# Patient Record
Sex: Male | Born: 1955 | Race: Black or African American | Hispanic: No | Marital: Married | Smoking: Never smoker
Health system: Southern US, Community
[De-identification: ages and names within clinical notes are randomized; demographics above are authoritative.]

## PROBLEM LIST (undated history)

## (undated) ENCOUNTER — Emergency Department (HOSPITAL_COMMUNITY): Payer: Managed Care, Other (non HMO) | Source: Home / Self Care

## (undated) DIAGNOSIS — Z8673 Personal history of transient ischemic attack (TIA), and cerebral infarction without residual deficits: Secondary | ICD-10-CM

## (undated) DIAGNOSIS — I639 Cerebral infarction, unspecified: Secondary | ICD-10-CM

## (undated) DIAGNOSIS — I10 Essential (primary) hypertension: Secondary | ICD-10-CM

## (undated) DIAGNOSIS — R55 Syncope and collapse: Secondary | ICD-10-CM

## (undated) HISTORY — DX: Essential (primary) hypertension: I10

## (undated) HISTORY — DX: Personal history of transient ischemic attack (TIA), and cerebral infarction without residual deficits: Z86.73

---

## 2012-10-29 ENCOUNTER — Encounter: Payer: Self-pay | Admitting: *Deleted

## 2012-11-08 ENCOUNTER — Ambulatory Visit: Payer: Self-pay | Admitting: Cardiovascular Disease

## 2013-11-30 ENCOUNTER — Emergency Department: Payer: Self-pay | Admitting: Internal Medicine

## 2013-11-30 LAB — URINALYSIS, COMPLETE
BACTERIA: NONE SEEN
BILIRUBIN, UR: NEGATIVE
Blood: NEGATIVE
Glucose,UR: NEGATIVE mg/dL (ref 0–75)
Ketone: NEGATIVE
Leukocyte Esterase: NEGATIVE
Nitrite: NEGATIVE
Ph: 6 (ref 4.5–8.0)
Protein: 30
RBC,UR: 1 /HPF (ref 0–5)
SPECIFIC GRAVITY: 1.013 (ref 1.003–1.030)
Squamous Epithelial: <1

## 2013-11-30 LAB — COMPREHENSIVE METABOLIC PANEL
ALBUMIN: 4.1 g/dL (ref 3.4–5.0)
ALK PHOS: 52 U/L
AST: 46 U/L — AB (ref 15–37)
Anion Gap: 12 (ref 7–16)
BILIRUBIN TOTAL: 0.5 mg/dL (ref 0.2–1.0)
BUN: 16 mg/dL (ref 7–18)
CALCIUM: 8.7 mg/dL (ref 8.5–10.1)
CO2: 23 mmol/L (ref 21–32)
Chloride: 98 mmol/L (ref 98–107)
Creatinine: 2.02 mg/dL — ABNORMAL HIGH (ref 0.60–1.30)
EGFR (Non-African Amer.): 35 — ABNORMAL LOW
GFR CALC AF AMER: 41 — AB
GLUCOSE: 109 mg/dL — AB (ref 65–99)
Osmolality: 268 (ref 275–301)
Potassium: 2.9 mmol/L — ABNORMAL LOW (ref 3.5–5.1)
SGPT (ALT): 56 U/L (ref 12–78)
Sodium: 133 mmol/L — ABNORMAL LOW (ref 136–145)
Total Protein: 8.7 g/dL — ABNORMAL HIGH (ref 6.4–8.2)

## 2013-11-30 LAB — CBC
HCT: 42.9 % (ref 40.0–52.0)
HGB: 14.1 g/dL (ref 13.0–18.0)
MCH: 27.3 pg (ref 26.0–34.0)
MCHC: 32.8 g/dL (ref 32.0–36.0)
MCV: 83 fL (ref 80–100)
PLATELETS: 216 10*3/uL (ref 150–440)
RBC: 5.15 10*6/uL (ref 4.40–5.90)
RDW: 14 % (ref 11.5–14.5)
WBC: 10 10*3/uL (ref 3.8–10.6)

## 2013-11-30 LAB — TROPONIN I: Troponin-I: <0.02 ng/mL

## 2015-03-22 ENCOUNTER — Encounter: Payer: Self-pay | Admitting: Family Medicine

## 2015-03-22 ENCOUNTER — Ambulatory Visit (INDEPENDENT_AMBULATORY_CARE_PROVIDER_SITE_OTHER): Payer: Managed Care, Other (non HMO) | Admitting: Family Medicine

## 2015-03-22 VITALS — BP 122/70 | HR 83 | Temp 98.9°F | Ht 70.0 in | Wt 229.8 lb

## 2015-03-22 DIAGNOSIS — E876 Hypokalemia: Secondary | ICD-10-CM | POA: Diagnosis not present

## 2015-03-22 DIAGNOSIS — I10 Essential (primary) hypertension: Secondary | ICD-10-CM

## 2015-03-22 DIAGNOSIS — T502X5A Adverse effect of carbonic-anhydrase inhibitors, benzothiadiazides and other diuretics, initial encounter: Secondary | ICD-10-CM | POA: Diagnosis not present

## 2015-03-22 DIAGNOSIS — Z0000 Encounter for general adult medical examination without abnormal findings: Secondary | ICD-10-CM | POA: Insufficient documentation

## 2015-03-22 DIAGNOSIS — R011 Cardiac murmur, unspecified: Secondary | ICD-10-CM | POA: Insufficient documentation

## 2015-03-22 DIAGNOSIS — E785 Hyperlipidemia, unspecified: Secondary | ICD-10-CM | POA: Diagnosis not present

## 2015-03-22 DIAGNOSIS — E669 Obesity, unspecified: Secondary | ICD-10-CM | POA: Insufficient documentation

## 2015-03-22 MED ORDER — POTASSIUM CHLORIDE 20 MEQ PO PACK: 20.0000 meq | PACK | Freq: Once | ORAL | Status: DC

## 2015-03-22 MED ORDER — BENICAR HCT 40-25 MG PO TABS: 1.0000 | ORAL_TABLET | Freq: Every day | ORAL | Status: DC

## 2015-03-22 NOTE — Progress Notes (Signed)
Name: Tyler Gomez   MRN: [id number]    DOB: 02/03/1956   Date:03/22/2015       Progress Note  Subjective  Chief Complaint  Chief Complaint  Patient presents with  . Follow-up    chloresterol check, Discuss ED med    Hyperlipidemia This is a chronic problem. Recent lipid tests were reviewed and are normal (HDL 35, LDL 108, TC 157 TG 70). He has no history of diabetes or hypothyroidism. Pertinent negatives include no chest pain, leg pain, myalgias or shortness of breath. He is currently on no antihyperlipidemic treatment.  Hypertension This is a chronic problem. The problem is controlled. Pertinent negatives include no chest pain, headaches, palpitations, peripheral edema or shortness of breath. Past treatments include angiotensin blockers and diuretics. The current treatment provides significant improvement. There is no history of angina, kidney disease, CAD/MI or CVA.      Past Medical History  Diagnosis Date  . Hypertension     History reviewed. No pertinent past surgical history.  Family History  Problem Relation Age of Onset  . Heart attack Father     History   Social History  . Marital Status: Married    Spouse Name: N/A  . Number of Children: N/A  . Years of Education: N/A   Occupational History  . Not on file.   Social History Main Topics  . Smoking status: Never Smoker   . Smokeless tobacco: Not on file  . Alcohol Use: No  . Drug Use: No  . Sexual Activity: Not on file   Other Topics Concern  . Not on file   Social History Narrative     Current outpatient prescriptions:  .  BENICAR HCT 40-25 MG per tablet, TK 1 T PO QD, Disp: , Rfl: 1  No Known Allergies   Review of Systems  Respiratory: Negative for shortness of breath and wheezing.   Cardiovascular: Negative for chest pain and palpitations.  Musculoskeletal: Negative for myalgias.  Neurological: Negative for headaches.      Objective  Filed Vitals:   03/22/15 1557  BP: 122/70   Pulse: 83  Temp: 98.9 F (37.2 C)  TempSrc: Oral  Height: 5\' 10"  (1.778 m)  Weight: 229 lb 12.8 oz (104.237 kg)  SpO2: 96%    Physical Exam  Constitutional: He is oriented to person, place, and time and well-developed, well-nourished, and in no distress.  HENT:  Head: Normocephalic and atraumatic.  Cardiovascular: Normal rate and regular rhythm.   Pulmonary/Chest: Effort normal and breath sounds normal.  Musculoskeletal:       Right ankle: He exhibits no swelling.       Left ankle: He exhibits no swelling.  Neurological: He is alert and oriented to person, place, and time.  Nursing note and vitals reviewed.      No results found for this or any previous visit (from the past 2160 hour(s)).   Assessment & Plan 1. Dyslipidemia  - Lipid Profile - Comprehensive metabolic panel  2. Diuretic-induced hypokalemia  - potassium chloride (KLOR-CON) 20 MEQ packet; Take 20 mEq by mouth once.  Dispense: 90 tablet; Refill: 1  3. Essential hypertension  - BENICAR HCT 40-25 MG per tablet; Take 1 tablet by mouth daily.  Dispense: 90 tablet; Refill: 1 There are no diagnoses linked to this encounter.  Vernadine Coombs Asad A[facility] 03/22/2015 4:33 PM

## 2015-03-23 LAB — COMPREHENSIVE METABOLIC PANEL
A/G RATIO: 1.4 (ref 1.1–2.5)
ALBUMIN: 4.7 g/dL (ref 3.5–5.5)
ALK PHOS: 62 IU/L (ref 39–117)
ALT: 57 IU/L — ABNORMAL HIGH (ref 0–44)
AST: 44 IU/L — ABNORMAL HIGH (ref 0–40)
BUN/Creatinine Ratio: 9 (ref 9–20)
BUN: 9 mg/dL (ref 6–24)
Bilirubin Total: 0.6 mg/dL (ref 0.0–1.2)
CALCIUM: 9.8 mg/dL (ref 8.7–10.2)
CO2: 26 mmol/L (ref 18–29)
Chloride: 96 mmol/L — ABNORMAL LOW (ref 97–108)
Creatinine, Ser: 0.97 mg/dL (ref 0.76–1.27)
GFR, EST AFRICAN AMERICAN: 98 mL/min/{1.73_m2} (ref >59)
GFR, EST NON AFRICAN AMERICAN: 85 mL/min/{1.73_m2} (ref >59)
GLUCOSE: 96 mg/dL (ref 65–99)
Globulin, Total: 3.3 g/dL (ref 1.5–4.5)
Potassium: 3.6 mmol/L (ref 3.5–5.2)
Sodium: 138 mmol/L (ref 134–144)
TOTAL PROTEIN: 8 g/dL (ref 6.0–8.5)

## 2015-03-23 LAB — LIPID PANEL
CHOLESTEROL TOTAL: 187 mg/dL (ref 100–199)
Chol/HDL Ratio: 5.1 ratio units — ABNORMAL HIGH (ref 0.0–5.0)
HDL: 37 mg/dL — AB (ref >39)
LDL Calculated: 130 mg/dL — ABNORMAL HIGH (ref 0–99)
TRIGLYCERIDES: 100 mg/dL (ref 0–149)
VLDL Cholesterol Cal: 20 mg/dL (ref 5–40)

## 2015-08-13 ENCOUNTER — Ambulatory Visit: Payer: Self-pay | Admitting: Family Medicine

## 2015-08-28 ENCOUNTER — Ambulatory Visit (INDEPENDENT_AMBULATORY_CARE_PROVIDER_SITE_OTHER): Payer: Managed Care, Other (non HMO) | Admitting: Family Medicine

## 2015-08-28 ENCOUNTER — Encounter: Payer: Self-pay | Admitting: Family Medicine

## 2015-08-28 VITALS — BP 118/80 | HR 73 | Temp 98.5°F | Resp 16 | Ht 70.0 in | Wt 218.4 lb

## 2015-08-28 DIAGNOSIS — R748 Abnormal levels of other serum enzymes: Secondary | ICD-10-CM

## 2015-08-28 DIAGNOSIS — I10 Essential (primary) hypertension: Secondary | ICD-10-CM | POA: Diagnosis not present

## 2015-08-28 DIAGNOSIS — E785 Hyperlipidemia, unspecified: Secondary | ICD-10-CM | POA: Diagnosis not present

## 2015-08-28 DIAGNOSIS — E876 Hypokalemia: Secondary | ICD-10-CM | POA: Diagnosis not present

## 2015-08-28 DIAGNOSIS — T502X5A Adverse effect of carbonic-anhydrase inhibitors, benzothiadiazides and other diuretics, initial encounter: Secondary | ICD-10-CM

## 2015-08-28 DIAGNOSIS — Z23 Encounter for immunization: Secondary | ICD-10-CM | POA: Diagnosis not present

## 2015-08-28 MED ORDER — BENICAR HCT 40-25 MG PO TABS: 1.0000 | ORAL_TABLET | Freq: Every day | ORAL | Status: DC

## 2015-08-28 MED ORDER — POTASSIUM CHLORIDE 20 MEQ PO PACK: 20.0000 meq | PACK | Freq: Once | ORAL | Status: DC

## 2015-08-28 NOTE — Progress Notes (Signed)
Name: Tyler Gomez   MRN: [id number]    DOB: 10/16/1955   Date:08/28/2015       Progress Note  Subjective  Chief Complaint  Chief Complaint  Patient presents with  . Follow-up    6 mo  . Hyperlipidemia  . Hypertension  . Medication Refill    benicar 40/25 / potassium 20    Hyperlipidemia This is a chronic problem. The problem is uncontrolled. Recent lipid tests were reviewed and are high. Exacerbating diseases include obesity. Pertinent negatives include no chest pain or shortness of breath. He is currently on no antihyperlipidemic treatment. Risk factors for coronary artery disease include dyslipidemia, male sex, obesity and hypertension.  Hypertension This is a chronic problem. The problem is unchanged. The problem is controlled. Pertinent negatives include no blurred vision, chest pain, headaches, malaise/fatigue, palpitations or shortness of breath. Past treatments include angiotensin blockers and diuretics. There is no history of kidney disease, CAD/MI or CVA.   Elevated Liver Enzymes Pt. Had elevated AST and ALT at 44 and 57  Respectively in June 2016. He has history of elevated liver enzymes in the past and laboratory work up was unremarkable. Abdominal Ultrasound was ordered but did not follow up.  He has no history of liver disease. Does not drink alcohol.  Past Medical History  Diagnosis Date  . Hypertension     History reviewed. No pertinent past surgical history.  Family History  Problem Relation Age of Onset  . Heart attack Father     Social History   Social History  . Marital Status: Married    Spouse Name: N/A  . Number of Children: N/A  . Years of Education: N/A   Occupational History  . Not on file.   Social History Main Topics  . Smoking status: Never Smoker   . Smokeless tobacco: Not on file  . Alcohol Use: No  . Drug Use: No  . Sexual Activity: Not on file   Other Topics Concern  . Not on file   Social History Narrative     Current  outpatient prescriptions:  .  BENICAR HCT 40-25 MG per tablet, Take 1 tablet by mouth daily., Disp: 90 tablet, Rfl: 1 .  potassium chloride (KLOR-CON) 20 MEQ packet, Take 20 mEq by mouth once., Disp: 90 tablet, Rfl: 1  No Known Allergies   Review of Systems  Constitutional: Negative for fever, weight loss and malaise/fatigue.  Eyes: Negative for blurred vision.  Respiratory: Negative for shortness of breath.   Cardiovascular: Negative for chest pain and palpitations.  Gastrointestinal: Negative for nausea, vomiting, abdominal pain, blood in stool and melena.  Genitourinary: Negative for dysuria, frequency and hematuria.  Neurological: Negative for headaches.    Objective  Filed Vitals:   08/28/15 1033  BP: 118/80  Pulse: 73  Temp: 98.5 F (36.9 C)  TempSrc: Oral  Resp: 16  Height: 5\' 10"  (1.778 m)  Weight: 218 lb 6.4 oz (99.066 kg)  SpO2: 97%    Physical Exam  Constitutional: He is oriented to person, place, and time and well-developed, well-nourished, and in no distress.  HENT:  Head: Normocephalic and atraumatic.  Eyes: Pupils are equal, round, and reactive to light.  Neck: Normal range of motion. Neck supple.  Cardiovascular: Normal rate, regular rhythm and normal heart sounds.   No murmur heard. Pulmonary/Chest: Effort normal and breath sounds normal. He has no wheezes. He has no rales.  Abdominal: Soft. Bowel sounds are normal. There is no tenderness.  Musculoskeletal: He  exhibits no edema.       Right ankle: He exhibits normal range of motion.       Left ankle: He exhibits normal range of motion.  Neurological: He is alert and oriented to person, place, and time.  Skin: Skin is warm and dry.  Psychiatric: Mood, memory, affect and judgment normal.  Nursing note and vitals reviewed.   Assessment & Plan  1. Need for immunization against influenza - Flu Vaccine QUAD 36+ mos PF IM (Fluarix & Fluzone Quad PF)  2. Dyslipidemia Obtain FLP for evaluation and  consider statin therapy. - Lipid Profile  3. Essential hypertension BP controlled and stable. Continue present therapy. - BENICAR HCT 40-25 MG tablet; Take 1 tablet by mouth daily.  Dispense: 90 tablet; Refill: 1  4. Elevated liver enzymes Patient has had complete workup in the past for elevated liver enzymes minus the abdominal ultrasound. Repeat levels today and we'll order an ultrasound if persistently elevated - Comprehensive Metabolic Panel (CMET)  5. Diuretic-induced hypokalemia  - Comprehensive Metabolic Panel (CMET) - potassium chloride (KLOR-CON) 20 MEQ packet; Take 20 mEq by mouth once.  Dispense: 90 tablet; Refill: 1    Tyler Gomez [facility] 08/28/2015 11:35 AM

## 2015-08-29 LAB — COMPREHENSIVE METABOLIC PANEL
A/G RATIO: 1.1 (ref 1.1–2.5)
ALBUMIN: 4.7 g/dL (ref 3.6–4.8)
ALT: 48 IU/L — ABNORMAL HIGH (ref 0–44)
AST: 37 IU/L (ref 0–40)
Alkaline Phosphatase: 69 IU/L (ref 39–117)
BUN/Creatinine Ratio: 11 (ref 10–22)
BUN: 8 mg/dL (ref 8–27)
Bilirubin Total: 0.5 mg/dL (ref 0.0–1.2)
CALCIUM: 10.3 mg/dL — AB (ref 8.6–10.2)
CO2: 27 mmol/L (ref 18–29)
Chloride: 100 mmol/L (ref 97–106)
Creatinine, Ser: 0.75 mg/dL — ABNORMAL LOW (ref 0.76–1.27)
GFR, EST AFRICAN AMERICAN: 115 mL/min/{1.73_m2} (ref >59)
GFR, EST NON AFRICAN AMERICAN: 100 mL/min/{1.73_m2} (ref >59)
GLOBULIN, TOTAL: 4.1 g/dL (ref 1.5–4.5)
Glucose: 107 mg/dL — ABNORMAL HIGH (ref 65–99)
POTASSIUM: 4.2 mmol/L (ref 3.5–5.2)
SODIUM: 143 mmol/L (ref 136–144)
TOTAL PROTEIN: 8.8 g/dL — AB (ref 6.0–8.5)

## 2015-08-29 LAB — LIPID PANEL
CHOLESTEROL TOTAL: 185 mg/dL (ref 100–199)
Chol/HDL Ratio: 5.4 ratio units — ABNORMAL HIGH (ref 0.0–5.0)
HDL: 34 mg/dL — ABNORMAL LOW (ref >39)
LDL Calculated: 120 mg/dL — ABNORMAL HIGH (ref 0–99)
TRIGLYCERIDES: 156 mg/dL — AB (ref 0–149)
VLDL Cholesterol Cal: 31 mg/dL (ref 5–40)

## 2015-09-15 ENCOUNTER — Other Ambulatory Visit: Payer: Self-pay | Admitting: Family Medicine

## 2015-09-24 ENCOUNTER — Ambulatory Visit: Payer: Managed Care, Other (non HMO) | Admitting: Family Medicine

## 2015-11-30 ENCOUNTER — Telehealth: Payer: Self-pay

## 2015-11-30 DIAGNOSIS — I10 Essential (primary) hypertension: Secondary | ICD-10-CM

## 2015-11-30 MED ORDER — LOSARTAN POTASSIUM-HCTZ 100-25 MG PO TABS: 1.0000 | ORAL_TABLET | Freq: Every day | ORAL | Status: DC

## 2015-11-30 NOTE — Telephone Encounter (Signed)
Please prescribe another BP med, Benicar-HCT was denied by his insurance company. He has to try and fail 2 other alternatives before it could be considered. The meds that are alternatives are:  Benezapril-HCTZ, Catopril-HCTZ, Enalapril-HCTZ, Lisinopril-HCTZ, Losartan-HCTZ, Ramipril-HCTZ. Please select an alternative and send it to his pharmacy. Patient is almost out of his medication. Thanks

## 2015-11-30 NOTE — Telephone Encounter (Signed)
Rx for losartan/HCTZ 100-25 milligrams sent to patient's pharmacy. Should return in 4 weeks to recheck BP

## 2015-11-30 NOTE — Telephone Encounter (Signed)
Called patient, had to leave a message for him to call back. Dr. Sherryll Burger has changed his medication to Losartan-HCTZ  And sent it his pharmacy per insurance company. He should make an appt to be rechecked in 4 week for his BP.

## 2016-02-24 ENCOUNTER — Other Ambulatory Visit: Payer: Self-pay | Admitting: Family Medicine

## 2016-02-25 ENCOUNTER — Encounter: Payer: Self-pay | Admitting: Family Medicine

## 2016-02-25 ENCOUNTER — Ambulatory Visit (INDEPENDENT_AMBULATORY_CARE_PROVIDER_SITE_OTHER): Payer: Managed Care, Other (non HMO) | Admitting: Family Medicine

## 2016-02-25 VITALS — BP 120/67 | HR 63 | Temp 98.1°F | Resp 15 | Ht 70.0 in | Wt 216.5 lb

## 2016-02-25 DIAGNOSIS — E785 Hyperlipidemia, unspecified: Secondary | ICD-10-CM

## 2016-02-25 DIAGNOSIS — R0981 Nasal congestion: Secondary | ICD-10-CM | POA: Diagnosis not present

## 2016-02-25 DIAGNOSIS — R748 Abnormal levels of other serum enzymes: Secondary | ICD-10-CM

## 2016-02-25 DIAGNOSIS — T502X5A Adverse effect of carbonic-anhydrase inhibitors, benzothiadiazides and other diuretics, initial encounter: Secondary | ICD-10-CM | POA: Diagnosis not present

## 2016-02-25 DIAGNOSIS — E876 Hypokalemia: Secondary | ICD-10-CM | POA: Diagnosis not present

## 2016-02-25 DIAGNOSIS — I10 Essential (primary) hypertension: Secondary | ICD-10-CM

## 2016-02-25 MED ORDER — FLUTICASONE PROPIONATE 50 MCG/ACT NA SUSP
2.0000 | Freq: Every day | NASAL | Status: DC
Start: 2016-02-25 — End: 2017-12-29

## 2016-02-25 MED ORDER — LOSARTAN POTASSIUM-HCTZ 100-25 MG PO TABS: 1.0000 | ORAL_TABLET | Freq: Every day | ORAL | Status: DC

## 2016-02-25 NOTE — Progress Notes (Signed)
Name: Tyler Gomez   MRN: [id number]    DOB: 08/11/1956   Date:02/25/2016       Progress Note  Subjective  Chief Complaint  Chief Complaint  Patient presents with  . Follow-up    6 mo  . Hyperlipidemia  . Medication Refill    losartan / potassium 20 meq    Hyperlipidemia This is a chronic problem. The problem is uncontrolled. Recent lipid tests were reviewed and are high. He has no history of diabetes or liver disease. Pertinent negatives include no chest pain or shortness of breath. He is currently on no antihyperlipidemic treatment.  Hypertension This is a chronic problem. The problem is controlled. Pertinent negatives include no chest pain, headaches, palpitations or shortness of breath. Past treatments include angiotensin blockers and diuretics. Compliance problems include diet.  There is no history of kidney disease, CAD/MI or CVA.   Elevated Liver Enzymes: Last lab work in November 2016 showed elevated liver enzymes, ALT was 48, AST was normal. Her had labs done at the [facility] 2 months ago and was reportedly normal.  Nasal Sinus Congestion: Pt. Requesting refill for Flonase for occasional weather-related nasal and sinus congestion. No coughing or wheezing.  Has been on Flonase in the past which helps relieve his symptoms.  Past Medical History  Diagnosis Date  . Hypertension     History reviewed. No pertinent past surgical history.  Family History  Problem Relation Age of Onset  . Heart attack Father     Social History   Social History  . Marital Status: Married    Spouse Name: N/A  . Number of Children: N/A  . Years of Education: N/A   Occupational History  . Not on file.   Social History Main Topics  . Smoking status: Never Smoker   . Smokeless tobacco: Not on file  . Alcohol Use: No  . Drug Use: No  . Sexual Activity: Not on file   Other Topics Concern  . Not on file   Social History Narrative     Current outpatient prescriptions:  .   losartan-hydrochlorothiazide (HYZAAR) 100-25 MG tablet, Take 1 tablet by mouth daily., Disp: 90 tablet, Rfl: 0 .  potassium chloride (KLOR-CON) 20 MEQ packet, Take 20 mEq by mouth once., Disp: 90 tablet, Rfl: 1  No Known Allergies   Review of Systems  Constitutional: Negative for fever and chills.  Respiratory: Negative for shortness of breath.   Cardiovascular: Negative for chest pain and palpitations.  Gastrointestinal: Negative for nausea, vomiting and abdominal pain.  Musculoskeletal: Positive for back pain and joint pain.  Neurological: Negative for headaches.    Objective  Filed Vitals:   02/25/16 1529  BP: 120/67  Pulse: 63  Temp: 98.1 F (36.7 C)  TempSrc: Oral  Resp: 15  Height:  (1.778 m)  Weight: 216 lb 8 oz (98.204 kg)  SpO2: 98%    Physical Exam  Constitutional: He is oriented to person, place, and time and well-developed, well-nourished, and in no distress.  HENT:  Head: Normocephalic and atraumatic.  Nasal mucosa normal, pink. Turbinates normal.  Eyes: Pupils are equal, round, and reactive to light.  Cardiovascular: Normal rate and regular rhythm.   Pulmonary/Chest: Effort normal and breath sounds normal.  Abdominal: Soft. Bowel sounds are normal.  Neurological: He is alert and oriented to person, place, and time.  Nursing note and vitals reviewed.     Assessment & Plan  1. Essential hypertension Blood pressure at goal, continue on  present antihypertensive therapy - losartan-hydrochlorothiazide (HYZAAR) 100-25 MG tablet; Take 1 tablet by mouth daily.  Dispense: 90 tablet; Refill: 1  2. Dyslipidemia  - Lipid Profile  3. Elevated liver enzymes Obtain liver enzymes and follow-up if persistently abnormal - Comprehensive Metabolic Panel (CMET)  4. Diuretic-induced hypokalemia  - Comprehensive Metabolic Panel (CMET)  5. Nasal sinus congestion Restarted on Flonase to be used for symptoms of nasal and sinus congestion - fluticasone  (FLONASE) 50 MCG/ACT nasal spray; Place 2 sprays into both nostrils daily.  Dispense: 16 g; Refill: 1   Tyler Gomez Asad A. Faylene [facility] 02/25/2016 3:55 PM

## 2016-09-01 ENCOUNTER — Ambulatory Visit: Payer: Managed Care, Other (non HMO) | Admitting: Family Medicine

## 2016-09-15 DIAGNOSIS — Z5321 Procedure and treatment not carried out due to patient leaving prior to being seen by health care provider: Secondary | ICD-10-CM | POA: Insufficient documentation

## 2016-09-15 DIAGNOSIS — I10 Essential (primary) hypertension: Secondary | ICD-10-CM | POA: Insufficient documentation

## 2016-09-15 LAB — BASIC METABOLIC PANEL
Anion gap: 7 (ref 5–15)
BUN: 7 mg/dL (ref 6–20)
CHLORIDE: 104 mmol/L (ref 101–111)
CO2: 27 mmol/L (ref 22–32)
Calcium: 9.4 mg/dL (ref 8.9–10.3)
Creatinine, Ser: 0.7 mg/dL (ref 0.61–1.24)
GFR calc Af Amer: >60 mL/min (ref >60)
GFR calc non Af Amer: >60 mL/min (ref >60)
Glucose, Bld: 114 mg/dL — ABNORMAL HIGH (ref 65–99)
POTASSIUM: 3.5 mmol/L (ref 3.5–5.1)
SODIUM: 138 mmol/L (ref 135–145)

## 2016-09-15 LAB — CBC WITH DIFFERENTIAL/PLATELET
Basophils Absolute: 0 10*3/uL (ref 0–0.1)
Basophils Relative: 1 %
EOS ABS: 0.1 10*3/uL (ref 0–0.7)
Eosinophils Relative: 3 %
HCT: 40.7 % (ref 40.0–52.0)
HEMOGLOBIN: 13.7 g/dL (ref 13.0–18.0)
LYMPHS ABS: 0.7 10*3/uL — AB (ref 1.0–3.6)
LYMPHS PCT: 14 %
MCH: 27.8 pg (ref 26.0–34.0)
MCHC: 33.7 g/dL (ref 32.0–36.0)
MCV: 82.5 fL (ref 80.0–100.0)
Monocytes Absolute: 0.7 10*3/uL (ref 0.2–1.0)
Monocytes Relative: 13 %
NEUTROS PCT: 69 %
Neutro Abs: 3.6 10*3/uL (ref 1.4–6.5)
Platelets: 222 10*3/uL (ref 150–440)
RBC: 4.93 MIL/uL (ref 4.40–5.90)
RDW: 13.9 % (ref 11.5–14.5)
WBC: 5.2 10*3/uL (ref 3.8–10.6)

## 2016-09-15 LAB — TROPONIN I

## 2016-09-15 NOTE — ED Triage Notes (Signed)
Pt presents to ED with c/o hypertension. Pt reports BP at home was 197-200 systolic. Pt reports being prescribed Carvedilol 6.25mg  BID, and Losartan HCTZ 100/25mg  once daily. Pt reports compliance with prescribed medications; states the Carvedilol was started 2 weeks ago. Pt denies any c/o headache, denies change in vision, or extremity weakness. Pt is A&O, in NAD, with respirations even, regular, and unlabored.

## 2016-09-16 ENCOUNTER — Encounter: Payer: Self-pay | Admitting: Family Medicine

## 2016-09-16 ENCOUNTER — Emergency Department
Admission: EM | Admit: 2016-09-16 | Discharge: 2016-09-16 | Disposition: A | Discharge status: Home / Self Care | Payer: Self-pay | Attending: Emergency Medicine | Admitting: Emergency Medicine

## 2016-09-16 ENCOUNTER — Ambulatory Visit (INDEPENDENT_AMBULATORY_CARE_PROVIDER_SITE_OTHER): Payer: Managed Care, Other (non HMO) | Admitting: Family Medicine

## 2016-09-16 VITALS — BP 157/90 | HR 69 | Temp 98.3°F | Resp 16 | Ht 70.0 in | Wt 213.2 lb

## 2016-09-16 DIAGNOSIS — E785 Hyperlipidemia, unspecified: Secondary | ICD-10-CM

## 2016-09-16 DIAGNOSIS — I10 Essential (primary) hypertension: Secondary | ICD-10-CM

## 2016-09-16 MED ORDER — LOSARTAN POTASSIUM-HCTZ 100-25 MG PO TABS: 1.0000 | ORAL_TABLET | Freq: Every day | ORAL | 1 refills | Status: DC

## 2016-09-16 NOTE — ED Notes (Signed)
Pt ambulatory to triage without difficulty or distress noted; vs retaken at request; pt denies any c/o and st ready to go home now; pt encouraged to stay and be evaluated but st has appt this afternoon with his PCP and will f/u with him; pt informed to return immediately for any further concerns; pt accomp by wife who also voices understanding to return immed for any further concerns

## 2016-09-16 NOTE — Progress Notes (Signed)
Name: Tyler Gomez   MRN: [id number]    DOB: 10/06/1956   Date:09/16/2016       Progress Note  Subjective  Chief Complaint  Chief Complaint  Patient presents with  . Follow-up    6 mo    Hypertension  This is a chronic problem. The problem is unchanged. The problem is controlled. Pertinent negatives include no blurred vision, chest pain, headaches, palpitations, shortness of breath or sweats. Risk factors for coronary artery disease include dyslipidemia. Past treatments include diuretics, angiotensin blockers and beta blockers. There is no history of kidney disease, CAD/MI or CVA.  Hyperlipidemia  This is a chronic problem. The problem is uncontrolled. Recent lipid tests were reviewed and are high. Pertinent negatives include no chest pain or shortness of breath. Current antihyperlipidemic treatment includes statins.     Past Medical History:  Diagnosis Date  . Hypertension     History reviewed. No pertinent surgical history.  Family History  Problem Relation Age of Onset  . Heart attack Father     Social History   Social History  . Marital status: Married    Spouse name: N/A  . Number of children: N/A  . Years of education: N/A   Occupational History  . Not on file.   Social History Main Topics  . Smoking status: Never Smoker  . Smokeless tobacco: Never Used  . Alcohol use No  . Drug use: No  . Sexual activity: Not on file   Other Topics Concern  . Not on file   Social History Narrative  . No narrative on file     Current Outpatient Prescriptions:  .  carvedilol (COREG) 6.25 MG tablet, Take 6.25 mg by mouth 2 (two) times daily with a meal., Disp: , Rfl:  .  fluticasone (FLONASE) 50 MCG/ACT nasal spray, Place 2 sprays into both nostrils daily., Disp: 16 g, Rfl: 1 .  potassium chloride (KLOR-CON) 20 MEQ packet, Take 20 mEq by mouth once., Disp: 90 tablet, Rfl: 1 .  losartan-hydrochlorothiazide (HYZAAR) 100-25 MG tablet, Take 1 tablet by mouth daily.  (Patient not taking: Reported on 09/16/2016), Disp: 90 tablet, Rfl: 1  No Known Allergies   Review of Systems  Eyes: Negative for blurred vision.  Respiratory: Negative for shortness of breath.   Cardiovascular: Negative for chest pain and palpitations.  Neurological: Negative for headaches.     Objective  Vitals:   09/16/16 1455  BP: (!) 157/90  Pulse: 69  Resp: 16  Temp: 98.3 F (36.8 C)  TempSrc: Oral  SpO2: 97%  Weight: 213 lb 3.2 oz (96.7 kg)  Height: 5\' 10"  (1.778 m)    Physical Exam  Constitutional: He is oriented to person, place, and time and well-developed, well-nourished, and in no distress.  HENT:  Head: Normocephalic and atraumatic.  Cardiovascular: Normal rate, regular rhythm and normal heart sounds.   No murmur heard. Pulmonary/Chest: Effort normal and breath sounds normal. He has no wheezes.  Abdominal: Soft. Bowel sounds are normal. There is no tenderness.  Musculoskeletal: He exhibits no edema.  Neurological: He is alert and oriented to person, place, and time.  Nursing note and vitals reviewed.       Assessment & Plan  1. Essential hypertension [facility] losartan-HCTZ was stopped, unclear as to why that was done. Since it was stopped, patient has noted increase in blood pressure. Advised to restart taking losartan-HCTZ, will taper off Carvedilol over 3 weeks. - losartan-hydrochlorothiazide (HYZAAR) 100-25 MG  tablet; Take 1 tablet by mouth daily.  Dispense: 90 tablet; Refill: 1  2. Dyslipidemia Last FLP obtained in November 2016. Tyler Gomez reports that [facility] and was advised to bring in for review.  Tyler Tomey Asad A. Faylene KurtzSh[facility] 09/16/2016 3:24 PM

## 2016-12-15 ENCOUNTER — Ambulatory Visit: Payer: Managed Care, Other (non HMO) | Admitting: Family Medicine

## 2017-07-05 ENCOUNTER — Observation Stay (HOSPITAL_BASED_OUTPATIENT_CLINIC_OR_DEPARTMENT_OTHER)
Admission: EM | Admit: 2017-07-05 | Discharge: 2017-07-06 | Disposition: A | Discharge status: Home / Self Care | Payer: Managed Care, Other (non HMO) | Source: Home / Self Care | Attending: Emergency Medicine | Admitting: Emergency Medicine

## 2017-07-05 ENCOUNTER — Emergency Department (HOSPITAL_COMMUNITY): Payer: Managed Care, Other (non HMO)

## 2017-07-05 ENCOUNTER — Encounter (HOSPITAL_COMMUNITY): Payer: Self-pay | Admitting: *Deleted

## 2017-07-05 DIAGNOSIS — Z79899 Other long term (current) drug therapy: Secondary | ICD-10-CM | POA: Insufficient documentation

## 2017-07-05 DIAGNOSIS — E876 Hypokalemia: Secondary | ICD-10-CM

## 2017-07-05 DIAGNOSIS — I63521 Cerebral infarction due to unspecified occlusion or stenosis of right anterior cerebral artery: Secondary | ICD-10-CM

## 2017-07-05 DIAGNOSIS — E236 Other disorders of pituitary gland: Secondary | ICD-10-CM

## 2017-07-05 DIAGNOSIS — H4749 Disorders of optic chiasm in (due to) other disorders: Secondary | ICD-10-CM

## 2017-07-05 DIAGNOSIS — I441 Atrioventricular block, second degree: Secondary | ICD-10-CM | POA: Diagnosis not present

## 2017-07-05 DIAGNOSIS — I63321 Cerebral infarction due to thrombosis of right anterior cerebral artery: Secondary | ICD-10-CM

## 2017-07-05 DIAGNOSIS — E785 Hyperlipidemia, unspecified: Secondary | ICD-10-CM

## 2017-07-05 DIAGNOSIS — Z8249 Family history of ischemic heart disease and other diseases of the circulatory system: Secondary | ICD-10-CM | POA: Diagnosis not present

## 2017-07-05 DIAGNOSIS — R29700 NIHSS score 0: Secondary | ICD-10-CM

## 2017-07-05 DIAGNOSIS — Z8679 Personal history of other diseases of the circulatory system: Secondary | ICD-10-CM | POA: Diagnosis not present

## 2017-07-05 DIAGNOSIS — I639 Cerebral infarction, unspecified: Secondary | ICD-10-CM

## 2017-07-05 DIAGNOSIS — I10 Essential (primary) hypertension: Secondary | ICD-10-CM | POA: Insufficient documentation

## 2017-07-05 DIAGNOSIS — G459 Transient cerebral ischemic attack, unspecified: Secondary | ICD-10-CM | POA: Diagnosis not present

## 2017-07-05 DIAGNOSIS — R0602 Shortness of breath: Secondary | ICD-10-CM | POA: Diagnosis not present

## 2017-07-05 DIAGNOSIS — R7303 Prediabetes: Secondary | ICD-10-CM | POA: Insufficient documentation

## 2017-07-05 LAB — CBC
HEMATOCRIT: 39.1 % (ref 39.0–52.0)
HEMOGLOBIN: 12.8 g/dL — AB (ref 13.0–17.0)
MCH: 27.7 pg (ref 26.0–34.0)
MCHC: 32.7 g/dL (ref 30.0–36.0)
MCV: 84.6 fL (ref 78.0–100.0)
Platelets: 239 10*3/uL (ref 150–400)
RBC: 4.62 MIL/uL (ref 4.22–5.81)
RDW: 13.5 % (ref 11.5–15.5)
WBC: 4.5 10*3/uL (ref 4.0–10.5)

## 2017-07-05 LAB — DIFFERENTIAL
BASOS ABS: 0 10*3/uL (ref 0.0–0.1)
Basophils Relative: 0 %
EOS ABS: 0.1 10*3/uL (ref 0.0–0.7)
EOS PCT: 2 %
LYMPHS ABS: 0.8 10*3/uL (ref 0.7–4.0)
LYMPHS PCT: 18 %
Monocytes Absolute: 0.4 10*3/uL (ref 0.1–1.0)
Monocytes Relative: 9 %
NEUTROS PCT: 71 %
Neutro Abs: 3.1 10*3/uL (ref 1.7–7.7)

## 2017-07-05 LAB — COMPREHENSIVE METABOLIC PANEL
ALBUMIN: 4.1 g/dL (ref 3.5–5.0)
ALK PHOS: 43 U/L (ref 38–126)
ALT: 34 U/L (ref 17–63)
AST: 45 U/L — AB (ref 15–41)
Anion gap: 8 (ref 5–15)
BILIRUBIN TOTAL: 0.8 mg/dL (ref 0.3–1.2)
BUN: 12 mg/dL (ref 6–20)
CO2: 26 mmol/L (ref 22–32)
CREATININE: 1.1 mg/dL (ref 0.61–1.24)
Calcium: 9.3 mg/dL (ref 8.9–10.3)
Chloride: 102 mmol/L (ref 101–111)
GFR calc Af Amer: >60 mL/min (ref >60)
GLUCOSE: 146 mg/dL — AB (ref 65–99)
Potassium: 3.2 mmol/L — ABNORMAL LOW (ref 3.5–5.1)
Sodium: 136 mmol/L (ref 135–145)
TOTAL PROTEIN: 8.2 g/dL — AB (ref 6.5–8.1)

## 2017-07-05 LAB — I-STAT CHEM 8, ED
BUN: 14 mg/dL (ref 6–20)
CREATININE: 1 mg/dL (ref 0.61–1.24)
Calcium, Ion: 0.97 mmol/L — ABNORMAL LOW (ref 1.15–1.40)
Chloride: 105 mmol/L (ref 101–111)
GLUCOSE: 145 mg/dL — AB (ref 65–99)
HCT: 42 % (ref 39.0–52.0)
HEMOGLOBIN: 14.3 g/dL (ref 13.0–17.0)
Potassium: 3.2 mmol/L — ABNORMAL LOW (ref 3.5–5.1)
Sodium: 139 mmol/L (ref 135–145)
TCO2: 22 mmol/L (ref 22–32)

## 2017-07-05 LAB — PROTIME-INR
INR: 1.07
Prothrombin Time: 13.8 seconds (ref 11.4–15.2)

## 2017-07-05 LAB — I-STAT TROPONIN, ED: TROPONIN I, POC: 0.04 ng/mL (ref 0.00–0.08)

## 2017-07-05 LAB — APTT: aPTT: 25 seconds (ref 24–36)

## 2017-07-05 MED ORDER — SODIUM CHLORIDE 0.9 % IV SOLN
INTRAVENOUS | Status: DC
  Administered 2017-07-05: 22:00:00 via INTRAVENOUS

## 2017-07-05 MED ORDER — IOPAMIDOL (ISOVUE-370) INJECTION 76%
INTRAVENOUS | Status: AC
  Administered 2017-07-05: 50 mL
  Filled 2017-07-05: qty 50

## 2017-07-05 MED ORDER — ACETAMINOPHEN 650 MG RE SUPP: 650.0000 mg | RECTAL | Status: DC | PRN

## 2017-07-05 MED ORDER — ASPIRIN EC 325 MG PO TBEC
325.0000 mg | DELAYED_RELEASE_TABLET | Freq: Every day | ORAL | Status: DC
  Administered 2017-07-05 – 2017-07-06 (×2): 325 mg via ORAL
  Filled 2017-07-05 (×2): qty 1

## 2017-07-05 MED ORDER — SENNOSIDES-DOCUSATE SODIUM 8.6-50 MG PO TABS: 1.0000 | ORAL_TABLET | Freq: Every evening | ORAL | Status: DC | PRN

## 2017-07-05 MED ORDER — CLOPIDOGREL BISULFATE 300 MG PO TABS
300.0000 mg | ORAL_TABLET | ORAL | Status: AC
  Administered 2017-07-05: 300 mg via ORAL
  Filled 2017-07-05: qty 1

## 2017-07-05 MED ORDER — ACETAMINOPHEN 160 MG/5ML PO SOLN: 650.0000 mg | ORAL | Status: DC | PRN

## 2017-07-05 MED ORDER — STROKE: EARLY STAGES OF RECOVERY BOOK
Freq: Once | Status: AC
  Administered 2017-07-05: 22:00:00

## 2017-07-05 MED ORDER — GADOBENATE DIMEGLUMINE 529 MG/ML IV SOLN
10.0000 mL | Freq: Once | INTRAVENOUS | Status: AC | PRN
  Administered 2017-07-05: 10 mL via INTRAVENOUS

## 2017-07-05 MED ORDER — ENOXAPARIN SODIUM 40 MG/0.4ML ~~LOC~~ SOLN
40.0000 mg | SUBCUTANEOUS | Status: DC
  Administered 2017-07-05: 40 mg via SUBCUTANEOUS
  Filled 2017-07-05: qty 0.4

## 2017-07-05 MED ORDER — ACETAMINOPHEN 325 MG PO TABS: 650.0000 mg | ORAL_TABLET | ORAL | Status: DC | PRN

## 2017-07-05 MED ORDER — CLOPIDOGREL BISULFATE 75 MG PO TABS
75.0000 mg | ORAL_TABLET | Freq: Every day | ORAL | Status: DC
  Administered 2017-07-06: 75 mg via ORAL
  Filled 2017-07-05: qty 1

## 2017-07-05 NOTE — Code Documentation (Signed)
61 y.o. Male with PMHx of HTN who reports that he was in his normal state of health today prior to having an acute onset of left-sided weakness around 0900 this morning. The patient reports that his symptoms resolved en route to [facility] but returned. On arrival to [facility], the patient was met at the bridge by the stroke team, airway cleared, labs drawn and patient taken to CT. CT with no acute intracranial abnormalities. ASPECTS 10. CTA w/ no LVO. At time of assessment, patient stating he feels that he is back to baseline. NIHSS 0. VAN negative. IV tPA not given d/t being too mild to treat. ED bedside handoff with ED RN Myriam Jacobson.

## 2017-07-05 NOTE — Progress Notes (Addendum)
Patient received from ED; oriented patient and family to room and unit routine; family at bedside

## 2017-07-05 NOTE — ED Notes (Signed)
CTA in progress at this time.

## 2017-07-05 NOTE — ED Notes (Signed)
Report attempted 

## 2017-07-05 NOTE — ED Provider Notes (Addendum)
[facility]-EMERGENCY DEPT Provider Note   CSN: [id number] Arrival date & time: 07/05/17  1015     History   Chief Complaint Chief Complaint  Patient presents with  . Code Stroke    HPI Tyler Gomez is a 61 y.o. male.  HPI Patient began having acute left-sided weakness while at church approximately 9:20 AM.  The patient's symptoms then resolved and EMS was called.  He became diaphoretic and developed left-sided weakness again.  On arrival to emergency department he was weak on his left side and brought as a code stroke.  He went to immediate head CT and CTA of the head.  No bleed was noted.  No acute arch vessel occlusion was noted.  When he returned back to his room he had resolved his left-sided symptoms.  No significant headache at this time.  He has a history of hypertension.  Denies chest pain shortness breath.   Past Medical History:  Diagnosis Date  . Hypertension     Patient Active Problem List   Diagnosis Date Noted  . Nasal sinus congestion 02/25/2016  . Elevated liver enzymes 08/28/2015  . Dyslipidemia 03/22/2015  . Encounter for general adult medical examination without abnormal findings 03/22/2015  . BP (high blood pressure) 03/22/2015  . Cardiac murmur 03/22/2015  . Adiposity 03/22/2015  . Diuretic-induced hypokalemia 03/22/2015    No past surgical history on file.     Home Medications    Prior to Admission medications   Medication Sig Start Date End Date Taking? Authorizing Provider  losartan-hydrochlorothiazide (HYZAAR) 100-25 MG tablet Take 1 tablet by mouth daily. 09/16/16  Yes Velta Addison A, MD  potassium chloride SA (K-DUR,KLOR-CON) 20 MEQ tablet Take 20 mEq by mouth daily as needed (FOR LOW POTASSIUM).   Yes [provider]  carvedilol (COREG) 6.25 MG tablet Take 6.25 mg by mouth 2 (two) times daily with a meal.    [provider]  fluticasone (FLONASE) 50 MCG/ACT nasal spray Place 2 sprays into both nostrils daily. 02/25/16    Ellyn Hack, MD  potassium chloride (KLOR-CON) 20 MEQ packet Take 20 mEq by mouth once. 08/28/15   Ellyn Hack, MD    Family History Family History  Problem Relation Age of Onset  . Heart attack Father     Social History Social History  Substance Use Topics  . Smoking status: Never Smoker  . Smokeless tobacco: Never Used  . Alcohol use No     Allergies   Patient has no known allergies.   Review of Systems Review of Systems  All other systems reviewed and are negative.    Physical Exam Updated Vital Signs BP 105/77   Pulse 61   Temp 97.8 F (36.6 C)   Resp 15   SpO2 100%   Physical Exam  Constitutional: He is oriented to person, place, and time. He appears well-developed and well-nourished.  HENT:  Head: Normocephalic and atraumatic.  Eyes: Pupils are equal, round, and reactive to light. EOM are normal.  Neck: Normal range of motion.  Cardiovascular: Normal rate, regular rhythm and intact distal pulses.   Pulmonary/Chest: Effort normal and breath sounds normal. No respiratory distress.  Abdominal: Soft. He exhibits no distension. There is no tenderness.  Musculoskeletal: Normal range of motion.  Neurological: He is alert and oriented to person, place, and time.  5/5 strength in major muscle groups of  bilateral upper and lower extremities. Speech normal. No facial asymetry.   Skin: Skin is  warm and dry.  Nursing note and vitals reviewed.    ED Treatments / Results  Labs (all labs ordered are listed, but only abnormal results are displayed) Labs Reviewed  CBC - Abnormal; Notable for the following:       Result Value   Hemoglobin 12.8 (*)    All other components within normal limits  COMPREHENSIVE METABOLIC PANEL - Abnormal; Notable for the following:    Potassium 3.2 (*)    Glucose, Bld 146 (*)    Total Protein 8.2 (*)    AST 45 (*)    All other components within normal limits  I-STAT CHEM 8, ED - Abnormal; Notable for the following:     Potassium 3.2 (*)    Glucose, Bld 145 (*)    Calcium, Ion 0.97 (*)    All other components within normal limits  PROTIME-INR  APTT  DIFFERENTIAL  I-STAT TROPONIN, ED  CBG MONITORING, ED    EKG  EKG Interpretation  Date/Time:  Sunday July 05 2017 11:01:03 EDT Ventricular Rate:  116 PR Interval:    QRS Duration: 97 QT Interval:  359 QTC Calculation: 470 R Axis:   87 Text Interpretation:  Sinus tachycardia Multiform ventricular premature complexes Second deg AVB, Mobitz I (Wenckebach) Left atrial enlargement Borderline right axis deviation Anteroseptal infarct, old Minimal ST depression, anterolateral leads No significant change was found Confirmed by Azalia Bilis ([id number]) on 07/05/2017 12:03:59 PM       Radiology Ct Angio Head W Or Wo Contrast  Result Date: 07/05/2017 CLINICAL DATA:  Left-sided weakness. EXAM: CT ANGIOGRAPHY HEAD AND NECK TECHNIQUE: Multidetector CT imaging of the head and neck was performed using the standard protocol during bolus administration of intravenous contrast. Multiplanar CT image reconstructions and MIPs were obtained to evaluate the vascular anatomy. Carotid stenosis measurements (when applicable) are obtained utilizing NASCET criteria, using the distal internal carotid diameter as the denominator. CONTRAST:  50 mL Isovue 370 COMPARISON:  Noncontrast head CT earlier today FINDINGS: CTA NECK FINDINGS Aortic arch: Standard 3 vessel aortic arch with mild atherosclerotic plaque. Widely patent brachiocephalic and subclavian arteries. Right carotid system: Patent with mild scattered intimal thickening and luminal irregularity. No stenosis or dissection. Left carotid system: Patent with mild scattered intimal thickening and luminal irregularity. Minimal calcified plaque in the proximal ICA. No stenosis or dissection. Vertebral arteries: Patent and codominant without evidence of stenosis or dissection. Mild non stenotic calcified plaque at the right vertebral  artery origin. Skeleton: No acute osseous abnormality or suspicious osseous lesion. Other neck: No mass or lymph node enlargement. Upper chest: Clear lung apices. Review of the MIP images confirms the above findings CTA HEAD FINDINGS Anterior circulation: The internal carotid arteries are patent from skullbase to carotid termini with left greater than right siphon atherosclerosis. There is mild-to-moderate stenosis of the supraclinoid and cavernous segments on the left with minimal to mild narrowing on the right. The sellar and suprasellar mass extending over the right posterior clinoid process does not demonstrate arterial enhancement and does not reflect an aneurysm. The MCAs are patent without M1 stenosis. There is moderate MCA branch vessel irregularity and narrowing bilaterally without evidence of proximal branch occlusion. The left A1 segment is not visualized and is likely congenitally absent. The right A1 segment is widely patent and supplies the left ACA. There is moderate ACA branch vessel irregularity and narrowing with asymmetric right A2 attenuation and missing right A3 branch vessels. Posterior circulation: The intracranial vertebral arteries are patent to the basilar with  atherosclerotic irregularity bilaterally. There are mild right and severe left V4 segment stenoses, with evaluation mildly limited by beam hardening. The basilar artery is patent and diffusely small, presumably on a congenital basis, with superimposed irregularity and mild narrowing. Proximal SCAs are grossly patent. The PCAs are patent and diffusely small in caliber without evidence of flow limiting proximal stenosis. The mass along the posterior right clinoid process abuts the proximal right PCA without vascular encasement. No aneurysm. Venous sinuses: Limited assessment by contrast timing. Anatomic variants: Absent left A1. Delayed phase: Not performed. Review of the MIP images confirms the above findings IMPRESSION: 1. No  emergent large vessel occlusion. 2. Intracranial atherosclerosis with mild right and moderate left ICA stenoses and diffuse branch vessel irregularity. 3. Right A2 attenuation with missing right ACA branch vessels. 4. Diminutive posterior circulation with mild right and severe left V4 stenoses and mild basilar narrowing. 5. Posterior right clinoid and sellar mass which does not reflect an aneurysm. An MRI is pending for further evaluation. 6. Widely patent cervical carotid arteries. Preliminary findings were discussed via telephone with Dr. Amada Jupiter on 07/05/2017 at 10:40 a.m. Electronically Signed   By: Sebastian Ache M.D.   On: 07/05/2017 11:21   Ct Angio Neck W Or Wo Contrast  Result Date: 07/05/2017 CLINICAL DATA:  Left-sided weakness. EXAM: CT ANGIOGRAPHY HEAD AND NECK TECHNIQUE: Multidetector CT imaging of the head and neck was performed using the standard protocol during bolus administration of intravenous contrast. Multiplanar CT image reconstructions and MIPs were obtained to evaluate the vascular anatomy. Carotid stenosis measurements (when applicable) are obtained utilizing NASCET criteria, using the distal internal carotid diameter as the denominator. CONTRAST:  50 mL Isovue 370 COMPARISON:  Noncontrast head CT earlier today FINDINGS: CTA NECK FINDINGS Aortic arch: Standard 3 vessel aortic arch with mild atherosclerotic plaque. Widely patent brachiocephalic and subclavian arteries. Right carotid system: Patent with mild scattered intimal thickening and luminal irregularity. No stenosis or dissection. Left carotid system: Patent with mild scattered intimal thickening and luminal irregularity. Minimal calcified plaque in the proximal ICA. No stenosis or dissection. Vertebral arteries: Patent and codominant without evidence of stenosis or dissection. Mild non stenotic calcified plaque at the right vertebral artery origin. Skeleton: No acute osseous abnormality or suspicious osseous lesion. Other neck:  No mass or lymph node enlargement. Upper chest: Clear lung apices. Review of the MIP images confirms the above findings CTA HEAD FINDINGS Anterior circulation: The internal carotid arteries are patent from skullbase to carotid termini with left greater than right siphon atherosclerosis. There is mild-to-moderate stenosis of the supraclinoid and cavernous segments on the left with minimal to mild narrowing on the right. The sellar and suprasellar mass extending over the right posterior clinoid process does not demonstrate arterial enhancement and does not reflect an aneurysm. The MCAs are patent without M1 stenosis. There is moderate MCA branch vessel irregularity and narrowing bilaterally without evidence of proximal branch occlusion. The left A1 segment is not visualized and is likely congenitally absent. The right A1 segment is widely patent and supplies the left ACA. There is moderate ACA branch vessel irregularity and narrowing with asymmetric right A2 attenuation and missing right A3 branch vessels. Posterior circulation: The intracranial vertebral arteries are patent to the basilar with atherosclerotic irregularity bilaterally. There are mild right and severe left V4 segment stenoses, with evaluation mildly limited by beam hardening. The basilar artery is patent and diffusely small, presumably on a congenital basis, with superimposed irregularity and mild narrowing. Proximal SCAs are grossly  patent. The PCAs are patent and diffusely small in caliber without evidence of flow limiting proximal stenosis. The mass along the posterior right clinoid process abuts the proximal right PCA without vascular encasement. No aneurysm. Venous sinuses: Limited assessment by contrast timing. Anatomic variants: Absent left A1. Delayed phase: Not performed. Review of the MIP images confirms the above findings IMPRESSION: 1. No emergent large vessel occlusion. 2. Intracranial atherosclerosis with mild right and moderate left ICA  stenoses and diffuse branch vessel irregularity. 3. Right A2 attenuation with missing right ACA branch vessels. 4. Diminutive posterior circulation with mild right and severe left V4 stenoses and mild basilar narrowing. 5. Posterior right clinoid and sellar mass which does not reflect an aneurysm. An MRI is pending for further evaluation. 6. Widely patent cervical carotid arteries. Preliminary findings were discussed via telephone with Dr. Amada Jupiter on 07/05/2017 at 10:40 a.m. Electronically Signed   By: Sebastian Ache M.D.   On: 07/05/2017 11:21   Ct Head Code Stroke Wo Contrast  Result Date: 07/05/2017 CLINICAL DATA:  Code stroke.  Left-sided weakness, improving. EXAM: CT HEAD WITHOUT CONTRAST TECHNIQUE: Contiguous axial images were obtained from the base of the skull through the vertex without intravenous contrast. COMPARISON:  None. FINDINGS: Brain: There is no evidence of acute infarct, intracranial hemorrhage, midline shift, or extra-axial fluid collection. The ventricles and sulci are normal for age. There is a lobulated 2 cm hyperdense mass in the sella and suprasellar cistern asymmetric to the right along the posterior clinoid process. Vascular: Calcified atherosclerosis at the skullbase. No hyperdense vessel. Skull: No fracture or focal osseous lesion. Sinuses/Orbits: Mild right ethmoid air cell mucosal thickening. Clear mastoid air cells. Unremarkable orbits. Other: None. ASPECTS [facility] Stroke [facility] Early CT Score) - Ganglionic level infarction (caudate, lentiform nuclei, internal capsule, insula, M1-M3 cortex): 7 - Supraganglionic infarction (M4-M6 cortex): 3 Total score (0-10 with 10 being normal): 10 IMPRESSION: 1. No evidence of acute infarct or hemorrhage. 2. ASPECTS is 10. 3. 2 cm sellar and suprasellar mass versus aneurysm. CTA is pending. These results were called by telephone on 07/05/2017 at 10:30 am to Dr. Amada Jupiter, who verbally acknowledged these results. Electronically Signed   By:  Sebastian Ache M.D.   On: 07/05/2017 10:50    Procedures Procedures (including critical care time)  Medications Ordered in ED Medications  aspirin EC tablet 325 mg (325 mg Oral Given 07/05/17 1139)  iopamidol (ISOVUE-370) 76 % injection (50 mLs  Contrast Given 07/05/17 1045)  clopidogrel (PLAVIX) tablet 300 mg (300 mg Oral Given 07/05/17 1139)     Initial Impression / Assessment and Plan / ED Course  I have reviewed the triage vital signs and the nursing notes.  Pertinent labs & imaging results that were available during my care of the patient were reviewed by me and considered in my medical decision making (see chart for details).     no left-sided weakness at this time.  Patient is not a candidate for TPA.  He seems to have stuttering TIA.  Resolution of symptoms.  We'll continue to monitor the patient closely while in the emergency department.  If his symptoms return and will notify neurology.  No TPA at this time.  Patient will need to be admitted the hospital for TIA workup.  Final Clinical Impressions(s) / ED Diagnoses   Final diagnoses:  None    New Prescriptions New Prescriptions   No medications on file     Azalia Bilis, MD 07/05/17 1203    Azalia Bilis,  MD 07/05/17 1204

## 2017-07-05 NOTE — Consult Note (Signed)
Neurology Consultation Reason for Consult: Left-sided weakness Referring Physician: Patria Mane, K  CC: Left-sided weakness  History is obtained from: Patient  HPI: Tyler Gomez is a 61 y.o. male with a history of hypertension who presents with left-sided weakness which started sometime around 9 AM. Following his initial left sided weakness, he had significant improvement but then en route he again worsened. On arrival, he still had some left-sided weakness but this again rapidly improved to an NIH of 0.  He states that something similar happened 2 weeks ago. Of note, he was also bradycardic during his episode with pulses down into the 30s.  LKW: 9 AM tpa given?: no, resolution of symptoms   ROS: A 14 point ROS was performed and is negative except as noted in the HPI.   Past Medical History:  Diagnosis Date  . Hypertension      Family History  Problem Relation Age of Onset  . Heart attack Father      Social History:  reports that he has never smoked. He has never used smokeless tobacco. He reports that he does not drink alcohol or use drugs.   Exam: Current vital signs: BP 102/75   Temp 97.8 F (36.6 C)  Vital signs in last 24 hours: Temp:  [97.8 F (36.6 C)] 97.8 F (36.6 C) (09/30 1053) BP: (102)/(75) 102/75 (09/30 1025)   Physical Exam  Constitutional: Appears well-developed and well-nourished.  Psych: Affect appropriate to situation Eyes: No scleral injection HENT: No OP obstrucion Head: Normocephalic.  Cardiovascular: Normal rate and regular rhythm.  Respiratory: Effort normal and breath sounds normal to anterior ascultation GI: Soft.  No distension. There is no tenderness.  Skin: WDI  Neuro: Mental Status: Patient is awake, alert, oriented to person, place, month, year, and situation. Patient is able to give a clear and coherent history. No signs of aphasia or neglect Cranial Nerves: II: Visual Fields are full. Pupils are equal, round, and reactive  to light.   III,IV, VI: EOMI without ptosis or diploplia.  V: Facial sensation is symmetric to temperature VII: Facial movement is symmetric.  VIII: hearing is intact to voice X: Uvula elevates symmetrically XI: Shoulder shrug is symmetric. XII: tongue is midline without atrophy or fasciculations.  Motor: Tone is normal. Bulk is normal. 5/5 strength was present in all four extremities.  Sensory: Sensation is symmetric to light touch and temperature in the arms and legs. 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 Given the recurrent nature, I would favor loading with aspirin and Plavix.  I suspect that the mass seen on CT is not related to  Recommendations: 1. HgbA1c, fasting lipid panel 2. MRI  of the brain with/without contrast 3. Frequent neuro checks 4. Echocardiogram 5. Prophylactic therapy-Antiplatelet med: Aspirin - dose  PO or  PR and Plavix 75 mg following 300 mg load 7. Risk factor modification 8. Telemetry monitoring 9. PT consult, OT consult, Speech consult 10. please page stroke NP  Or  PA  Or MD  from 8am -4 pm as this patient will be followed by the stroke team at this point.   You can look them up on [email or web address]      Ritta Slot, MD [facility] [phone]  If 7pm- 7am, please page neurology on call  as listed in [place].

## 2017-07-05 NOTE — ED Notes (Signed)
Family at bedside. 

## 2017-07-05 NOTE — ED Notes (Signed)
Patient transported to CT with this RN, stroke RN and Neurologist

## 2017-07-05 NOTE — ED Notes (Signed)
Pt able to ambulate to bathroom with steady gait and no assistance.

## 2017-07-05 NOTE — H&P (Signed)
Date: 07/05/2017               Patient Name:  Tyler Gomez MRN: [id number]  DOB: September 11, 1956 Age / Sex: 61 y.o., male   PCP: Ellyn Hack, MD         Medical Service: Internal Medicine Teaching Service         Attending Physician: Dr. Sandre Kitty Elwin Mocha, MD    First Contact: Dr. Crista Elliot Pager: [phone]  Second Contact: Dr. Mikey Bussing Pager: [phone]       After Hours (After 5p/  First Contact Pager: [phone]  weekends / holidays): Second Contact Pager: [phone]   Chief Complaint: "sudden left sided weakness"  History of Present Illness: Tyler Gomez is a pleasant 61 year old male who presented to the ED following an acute episode of left sided weakness and brief alteration in mentation. He has a past medical history significant for hypertension. Patient stated that he thought his left leg went out on him as it sometimes does, however, the patients family was present and stated that he had informed one of them that he did not feel right and needed to sit down. He subsequently did just that and was further assisted to the floor due to  progressive weakness. They stated that he was cold, clammy, diaphoretic, appeared less responsive, ("almost dazed"), he was stuttering his speech and he could not utilize the left half of his body. The symptoms eventually resolved, and the patient proceeded to leave church on without direct assistance, although much was available. Prior to reaching his vehicle however, family memembers again noticed stuttering of his speech, and decreased verbal responsiveness whereupon he was noted to be unable to raise his left arm or ambulate. EMS was called and he was transported to the ED where his symptoms again began to resolve. The patient states that he initially thought this was just left leg pain with subsequent loss of strength in that leg. Two weeks prior he had experienced similar weakness after kneeling on his left leg while at work. This resolved over  a few days and only reoccurred and he can help for work. The patient felt as if this was similar initially, but is unable to recall todays events that four members of his family ardently attest to  The patient denied headache, visual changes, nausea, vomiting, muscle aches, fever, chills, dysuria, chest pain, abdominal pain, palpitations, tremor, similar symptoms in the past, or symptoms preceding the event. The patient denied visual aura, migraine, or loss of sensation.   In the ED code stroke was called and neurology ordered a CT of the head which was negative for hemorrhage but otherwise inconclusive as was CT angiogram of the head and neck for additional acute process. He was again noted to have verbal stuttering, and subsequent loss of strength on the left which also rapidly resolved. He was given ASA and clopidogrel. Additional labs including pro time INR, APTT, CBC with differential, and CMP were only remarkable for potassium of 3.2. The patient's troponin was negative with pending lipid panel and hemoglobin A1c.  Meds:  Current Meds  Medication Sig  . losartan-hydrochlorothiazide (HYZAAR) 100-25 MG tablet Take 1 tablet by mouth daily.  . potassium chloride SA (K-DUR,KLOR-CON) 20 MEQ tablet Take 20 mEq by mouth daily as needed (FOR LOW POTASSIUM).     Allergies: Allergies as of 07/05/2017  . (No Known Allergies)   Past Medical History:  Diagnosis Date  . Hypertension     Family  History:  Family History  Problem Relation Age of Onset  . Heart attack Father      Social History:  Social History  Substance Use Topics  . Smoking status: Never Smoker  . Smokeless tobacco: Never Used  . Alcohol use No    Review of Systems: A complete ROS was negative except as per HPI.   Physical Exam: Blood pressure 107/78, pulse 62, temperature 97.8 F (36.6 C), resp. rate 13, SpO2 100 %. Physical Exam  Constitutional: He appears well-developed and well-nourished. No distress.  HENT:    Head: Normocephalic and atraumatic.  Eyes: Pupils are equal, round, and reactive to light.  Patient unable to track. Loses contact with object, visual fields not assess fully. Appeared to be mild decrease in lateral peripheral vision, please assess again each visit.   Neck: Normal range of motion. No JVD present.  Cardiovascular: Normal rate.  An irregular rhythm present.  No murmur heard. Pulmonary/Chest: Effort normal and breath sounds normal. No stridor. No respiratory distress.  Abdominal: Soft. Bowel sounds are normal. He exhibits no distension.  Musculoskeletal: He exhibits no edema.  Neurological: He is alert. He has normal strength. Coordination (Patient unable to acuratly perform cerebelar evaluation with finger to nose as he often missed his target) abnormal.  Coordination issues could be secondary to visual fields disturbance   Skin: Skin is warm. He is not diaphoretic.  Psychiatric: He has a normal mood and affect.  Nursing note and vitals reviewed.  EKG: personally reviewed my interpretation is mobitz type 2 arhythmia, possible PVC's, left ventricular enlargement   CXR: personally reviewed my interpretation is n/a  Assessment & Plan by Problem: Active Problems:   Stroke (cerebrum) (HCC)   Mobitz type 1 second degree atrioventricular block   TIA (transient ischemic attack)   CVA (cerebral vascular accident) (HCC)  Tyler Gomez is a pleasant 61 year old male who is admitted for stroke like symptoms which abruptly resolved. Given his presentation of acute onset left-sided weakness, MRI findings consistent with acute cerebral infarct, recurrence of the symptoms, the patient is most likely suffering from a stroke. He will be admitted for complete evaluation of underlying pathological process and treated for stroke. Initial presentation was concerning for TIA vs stroke.    1. 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 There is also mild mass effect on the optic chiasm from the cyst."  2. Mobitz type 1 second degree AV block: Noted on EKG -Will repeat the EKG -Plan to consult cardiology if present on repeat ordered at 6:12pm  Diet: Regular Fluids: Code: Full Dvt ppx: Dispo: Admit patient to Observation with expected length of stay less than 2 midnights.  Signed: Lanelle Bal, MD 07/05/2017, 6:09 PM  Pager: Pager# [phone]

## 2017-07-05 NOTE — ED Triage Notes (Signed)
Pt arrives via [facility], ems reports pt was at church when he began having left sided weakness at 920, pts symptoms resolved but pt became diaphoretic after placing pt on truck and left side went flaccid, and pt had episode of incontinence. cbg 167, bp 100/60, hr 60's. Pt had left sided weakness upon arrival to ED but able to answer questions, airway intact.

## 2017-07-06 ENCOUNTER — Other Ambulatory Visit (HOSPITAL_COMMUNITY): Payer: Managed Care, Other (non HMO)

## 2017-07-06 DIAGNOSIS — R7303 Prediabetes: Secondary | ICD-10-CM

## 2017-07-06 DIAGNOSIS — I441 Atrioventricular block, second degree: Secondary | ICD-10-CM | POA: Diagnosis not present

## 2017-07-06 DIAGNOSIS — R9389 Abnormal findings on diagnostic imaging of other specified body structures: Secondary | ICD-10-CM

## 2017-07-06 DIAGNOSIS — I6300 Cerebral infarction due to thrombosis of unspecified precerebral artery: Secondary | ICD-10-CM

## 2017-07-06 DIAGNOSIS — E236 Other disorders of pituitary gland: Secondary | ICD-10-CM | POA: Diagnosis not present

## 2017-07-06 DIAGNOSIS — I63521 Cerebral infarction due to unspecified occlusion or stenosis of right anterior cerebral artery: Secondary | ICD-10-CM | POA: Diagnosis not present

## 2017-07-06 DIAGNOSIS — H4749 Disorders of optic chiasm in (due to) other disorders: Secondary | ICD-10-CM | POA: Diagnosis not present

## 2017-07-06 LAB — HEMOGLOBIN A1C
Hgb A1c MFr Bld: 5.9 % — ABNORMAL HIGH (ref 4.8–5.6)
Mean Plasma Glucose: 122.63 mg/dL

## 2017-07-06 LAB — LIPID PANEL
CHOL/HDL RATIO: 4.8 ratio
CHOLESTEROL: 135 mg/dL (ref 0–200)
HDL: 28 mg/dL — AB (ref >40)
LDL Cholesterol: 91 mg/dL (ref 0–99)
TRIGLYCERIDES: 79 mg/dL (ref <150)
VLDL: 16 mg/dL (ref 0–40)

## 2017-07-06 LAB — HIV ANTIBODY (ROUTINE TESTING W REFLEX): HIV Screen 4th Generation wRfx: NONREACTIVE

## 2017-07-06 MED ORDER — POTASSIUM CHLORIDE 20 MEQ PO PACK
20.0000 meq | PACK | Freq: Three times a day (TID) | ORAL | Status: DC
  Filled 2017-07-06 (×2): qty 1

## 2017-07-06 MED ORDER — CLOPIDOGREL BISULFATE 75 MG PO TABS: 75.0000 mg | ORAL_TABLET | Freq: Every day | ORAL | 0 refills | Status: DC

## 2017-07-06 MED ORDER — POTASSIUM CHLORIDE CRYS ER 20 MEQ PO TBCR: 20.0000 meq | EXTENDED_RELEASE_TABLET | Freq: Three times a day (TID) | ORAL | 0 refills | Status: DC

## 2017-07-06 MED ORDER — ATORVASTATIN CALCIUM 40 MG PO TABS: 40.0000 mg | ORAL_TABLET | Freq: Every day | ORAL | 0 refills | Status: DC

## 2017-07-06 MED ORDER — ASPIRIN 325 MG PO TBEC: 325.0000 mg | DELAYED_RELEASE_TABLET | Freq: Every day | ORAL | 0 refills | Status: DC

## 2017-07-06 MED ORDER — ATORVASTATIN CALCIUM 40 MG PO TABS: 40.0000 mg | ORAL_TABLET | Freq: Every day | ORAL | Status: DC

## 2017-07-06 NOTE — Progress Notes (Signed)
Patient is requesting note for work. Per PT, employer want to know when he can return to work. MD notified

## 2017-07-06 NOTE — Progress Notes (Signed)
D/c reviewed with Patient and spouse. Encouraged to follow through with follow up appointment. Encouraged to adhere to discharge meds as prescribed. No further questions at this time-Will continue to monitor

## 2017-07-06 NOTE — Evaluation (Signed)
Occupational Therapy Evaluation Patient Details Name: Tyler Gomez MRN: [id number] DOB: 21-Mar-1956 Today's Date: 07/06/2017    History of Present Illness 61 year old male who presented to the ED following an acute episode of left sided weakness and brief alteration in mentation. MRI showing small R ACA infarct and 2cm sellar and suprasellar mass.    Clinical Impression   PTA, pt was living with his wife and independent with ADLs, IADLs, driving, and working. Pt currently demonstrating decreased cognition and requiring Mod VCs for trail making task, Max VCs for simple money management task, and recalled 1/3 words for ST memory. Recommend dc to neuro outpatient clinic for OT follow up to optimize return to PLOF and independence with IADLs. Will continues to follow acutely to facilitate safe dc.     Follow Up Recommendations  Outpatient OT;Supervision - Intermittent (Neuro outpatient)    Equipment Recommendations  None recommended by OT    Recommendations for Other Services       Precautions / Restrictions Precautions Precautions: Fall Restrictions Weight Bearing Restrictions: No      Mobility Bed Mobility               General bed mobility comments: Pt sitting up in recliner upon PT arrival.   Transfers Overall transfer level: Modified independent Equipment used: None Transfers: Sit to/from Stand           General transfer comment: Pt demonstrated proper hand placement on seated surface for safety. No assist required.     Balance Overall balance assessment: Needs assistance Sitting-balance support: No upper extremity supported;Feet supported Sitting balance-Leahy Scale: Good     Standing balance support: No upper extremity supported;During functional activity Standing balance-Leahy Scale: Good                   Standardized Balance Assessment Standardized Balance Assessment : Dynamic Gait Index   Dynamic Gait Index Level Surface: Normal Gait  with Horizontal Head Turns: Mild Impairment Gait with Vertical Head Turns: Severe Impairment Gait and Pivot Turn: Normal Step Over Obstacle: Normal Steps: Normal     ADL either performed or assessed with clinical judgement   ADL Overall ADL's : Modified independent                                       General ADL Comments: Pt near baseline fucntion with increased time for IADLs and Min-Mod VCs. Pt Mod I for ADLs and fucntional mobility. Pt performing menu task independently follow multi-step instructions. Requiring Mod A for trail making task, memory testing, and attention. Educated pt and wife on decreased cognition and benefits of neuro OP clinic.     Vision Baseline Vision/History: Wears glasses Wears Glasses: At all times Patient Visual Report: No change from baseline       Perception     Praxis      Pertinent Vitals/Pain Pain Assessment: No/denies pain     Hand Dominance Right   Extremity/Trunk Assessment Upper Extremity Assessment Upper Extremity Assessment: Overall WFL for tasks assessed   Lower Extremity Assessment Lower Extremity Assessment: Defer to PT evaluation   Cervical / Trunk Assessment Cervical / Trunk Assessment: Normal   Communication Communication Communication: No difficulties   Cognition Arousal/Alertness: Awake/alert Behavior During Therapy: WFL for tasks assessed/performed Overall Cognitive Status: Impaired/Different from baseline Area of Impairment: Attention;Memory;Following commands;Problem solving;Awareness  Current Attention Level: Alternating Memory: Decreased short-term memory Following Commands: Follows multi-step commands inconsistently;Follows multi-step commands with increased time   Awareness: Emergent Problem Solving: Slow processing;Requires verbal cues;Requires tactile cues General Comments: Pt presenting with decreased memory, problem solving, and attention. Pt recalling 1/3 words  for ST memory testing. Able to name 4/5 animals that start with F. During trail making task with map, pt requiring Mod VCs to use map and Min VCs to follow directions.Pt requiring Max VCs to perform simple money management task. Discussed deficits with pt and wife and educated on external compensatory techiques for cognititon.   General Comments  Wife present at end of session for education on cognittion deficits.     Exercises     Shoulder Instructions      Home Living Family/patient expects to be discharged to:: Private residence Living Arrangements: Spouse/significant other Available Help at Discharge: Family;Available 24 hours/day Type of Home: House Home Access: Level entry     Home Layout: One level     Bathroom Shower/Tub: Chief Strategy Officer: Standard Bathroom Accessibility: Yes   Home Equipment: None          Prior Functioning/Environment Level of Independence: Independent        Comments: ADLs, IADLs, driving, and Works full time Environmental manager Problem List: Decreased cognition      OT Treatment/Interventions: Self-care/ADL training;Therapeutic exercise;Energy conservation;DME and/or AE instruction;Therapeutic activities;Patient/family education;Cognitive remediation/compensation    OT Goals(Current goals can be found in the care plan section) Acute Rehab OT Goals Patient Stated Goal: Home today OT Goal Formulation: With patient Time For Goal Achievement: 07/20/17 Potential to Achieve Goals: Good ADL Goals Additional ADL Goal #1: Pt will perform four step trail making task with two or less VCs Additional ADL Goal #2: Pt will perform three step money managing task with two or less VCs  OT Frequency: Min 2X/week   Barriers to D/C:            Co-evaluation              AM-PAC PT "6 Clicks" Daily Activity     Outcome Measure Help from another person eating meals?: None Help from another person taking care of  personal grooming?: None Help from another person toileting, which includes using toliet, bedpan, or urinal?: None Help from another person bathing (including washing, rinsing, drying)?: None Help from another person to put on and taking off regular upper body clothing?: None Help from another person to put on and taking off regular lower body clothing?: None 6 Click Score: 24   End of Session Equipment Utilized During Treatment: Gait belt Nurse Communication: Mobility status  Activity Tolerance: Patient tolerated treatment well Patient left: in chair;with call bell/phone within reach;with family/visitor present  OT Visit Diagnosis: Unsteadiness on feet (R26.81);Other symptoms and signs involving cognitive function                Time: 1135-1152 OT Time Calculation (min): 17 min Charges:  OT General Charges $OT Visit: 1 Visit OT Evaluation $OT Eval Moderate Complexity: 1 Mod G-Codes:     Tyler Gomez MSOT, OTR/L Acute Rehab Pager: [phone] Office: [phone]  Tyler Gomez Tyler Gomez 07/06/2017, 1:26 PM

## 2017-07-06 NOTE — Evaluation (Signed)
Speech Language Pathology Evaluation Patient Details Name: Tyler Gomez MRN: [id number] DOB: [birth year] Today's Date: 07/06/2017 Time: 8295-6213 SLP Time Calculation (min) (ACUTE ONLY): 30 min  Problem List:  Patient Active Problem List   Diagnosis Date Noted  . Mobitz type 1 second degree atrioventricular block 07/05/2017  . CVA (cerebral vascular accident) (HCC) 07/05/2017  . Nasal sinus congestion 02/25/2016  . Elevated liver enzymes 08/28/2015  . Dyslipidemia 03/22/2015  . Encounter for general adult medical examination without abnormal findings 03/22/2015  . BP (high blood pressure) 03/22/2015  . Cardiac murmur 03/22/2015  . Adiposity 03/22/2015  . Diuretic-induced hypokalemia 03/22/2015   Past Medical History:  Past Medical History:  Diagnosis Date  . Hypertension    Past Surgical History: No past surgical history on file. HPI:  Tyler Gomez is a pleasant 61 year old male who presented to the ED following an acute episode of left sided weakness and brief alteration in mentation. He has a past medical history significant for hypertension. Patient stated that he thought his left leg went out on him as it sometimes does, however, the patients family was present and stated that he had informed one of them that he did not feel right and needed to sit down. He subsequently did just that and was further assisted to the floor due to progressive weakness. They stated that he was cold, clammy, diaphoretic, appeared less responsive, ("almost dazed"), he was stuttering his speech and he could not utilize the left half of his body. The symptoms eventually resolved, and the patient proceeded to leave church on without direct assistance, although much was available. Prior to reaching his vehicle however, family memembers again noticed stuttering of his speech, and decreased verbal responsiveness whereupon he was noted to be unable to raise his left arm or ambulate. EMS was called and he  was transported to the ED where his symptoms again began to resolve. The patient states that he initially thought this was just left leg pain with subsequent loss of strength in that leg. Two weeks prior he had experienced similar weakness after kneeling on his left leg while at work. MRI revealed Small acute right ACA infarct and 2 cm sellar and suprasellar mass with signal characteristics   Assessment / Plan / Recommendation Clinical Impression  Pt presents with functional cognitive linguistic abilities. Pt's speech is also intelligible at the simple conversation level for unfamiliar topics. Wife present to verify level of function. No further skilled ST indicated. ST to sign off.     SLP Assessment  SLP Recommendation/Assessment: Patient does not need any further Speech Lanaguage Pathology Services SLP Visit Diagnosis: Cognitive communication deficit (R41.841)    Follow Up Recommendations  None          SLP Evaluation Cognition  Overall Cognitive Status: Within Functional Limits for tasks assessed Arousal/Alertness: Awake/alert Orientation Level: Oriented X4 Attention: Selective Sustained Attention: Appears intact Selective Attention: Appears intact Memory: Appears intact Awareness: Appears intact Problem Solving: Appears intact Safety/Judgment: Appears intact       Comprehension  Auditory Comprehension Overall Auditory Comprehension: Appears within functional limits for tasks assessed Commands: Within Functional Limits Conversation: Simple Visual Recognition/Discrimination Discrimination: Not tested Reading Comprehension Reading Status: Within funtional limits    Expression Expression Primary Mode of Expression: Verbal Verbal Expression Overall Verbal Expression: Appears within functional limits for tasks assessed Initiation: No impairment Level of Generative/Spontaneous Verbalization: Conversation Repetition: No impairment Naming: No impairment Pragmatics: No  impairment Non-Verbal Means of Communication: Not applicable Written Expression Dominant Hand:  Right Written Expression: Not tested   Oral / Motor  Oral Motor/Sensory Function Overall Oral Motor/Sensory Function: Within functional limits Motor Speech Overall Motor Speech: Appears within functional limits for tasks assessed Respiration: Within functional limits Phonation: Normal Resonance: Within functional limits Articulation: Within functional limitis Intelligibility: Intelligible Motor Planning: Witnin functional limits Motor Speech Errors: Not applicable   GO                    Lalia Loudon 07/06/2017, 2:41 PM

## 2017-07-06 NOTE — Discharge Summary (Signed)
Name: Tyler Gomez MRN: [id number] DOB: [birth year] 61 y.o. PCP: Ellyn Hack, MD  Date of Admission: 07/05/2017 10:15 AM Date of Discharge: 07/06/2017 Attending Physician: No att. providers found  Discharge Diagnosis:  Principal Problem:   CVA (cerebral vascular accident) [facility]) Active Problems:   Mobitz type 1 second degree atrioventricular block  Discharge Medications: Allergies as of 07/06/2017   No Known Allergies     Medication List    STOP taking these medications   carvedilol 6.25 MG tablet Commonly known as:  COREG   potassium chloride 20 MEQ packet Commonly known as:  KLOR-CON     TAKE these medications   aspirin 325 MG EC tablet Take 1 tablet (325 mg total) by mouth daily.   atorvastatin 40 MG tablet Commonly known as:  LIPITOR Take 1 tablet (40 mg total) by mouth daily at 6 PM.   clopidogrel 75 MG tablet Commonly known as:  PLAVIX Take 1 tablet (75 mg total) by mouth daily.   fluticasone 50 MCG/ACT nasal spray Commonly known as:  FLONASE Place 2 sprays into both nostrils daily.   losartan-hydrochlorothiazide 100-25 MG tablet Commonly known as:  HYZAAR Take 1 tablet by mouth daily.   potassium chloride SA 20 MEQ tablet Commonly known as:  K-DUR,KLOR-CON Take 1 tablet (20 mEq total) by mouth 3 (three) times daily. What changed:  when to take this  reasons to take this      Disposition and follow-up:   Tyler Gomez was discharged from [facility] in Good condition.  At the hospital follow up visit please address:  1.  The patient presented with multiple episodes of left sided weakness and was found to have an acute infarction in the territory of the right ACA. He was admitted for management and stroke workup. His workup did not reveal a source for his stroke, however neurology consult believed this to be cardioembolic and would like additional studies completed prior to their 6 week follow up (as outlined  below). Patient's symptoms of L weakness improved during hospitalization. Patient discharged with outpatient PT follow up. Please assess PT participation and any residual L sided weakness/deficits.   He was sent home with dual antiplatelet therapy for 3 weeks (clopidogrel and aspirin), with recommendations to move to only aspirin 325 mg after 3 weeks time. He was also given lipitor 40 mg for cholesterol management. His A1C suggested pre-diabetes and need for diet and lifestyle modifications. Please assess how he is doing with medications and diabetic diet.  2.  Labs / imaging needed at time of follow-up: 1) TEE and 2) continuous cardiac monitoring ([facility] consulted on discharge, please follow up to ensure testing arranged)  3.  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 These studies suggested a slight mass effect on optic chiasm and neurosurgery follow up/evaluation is recommended.  Follow-up Appointments: Follow-up Information    Ellyn Hack, MD. Schedule an appointment as soon as possible for a visit in 1 week(s).   Specialty:  Family Medicine Why:  Please follow up with your PCP in 1 week regarding your hospitalization, new prescriptions, and follow up of your chronic medical conditions.  Contact information: [address] [place] [address] [phone]        Micki Riley, MD. Schedule an appointment as soon as possible for a visit in 6 week(s).  Specialties:  Neurology, Radiology Why:  Follow up in 6 weeks. He is requesting tests be completed prior to this visit. You should receive a call from a cardiologist regarding these tests. If you do not hear from them, please call [facility] at [phone]. Contact information: [address] [place] [address] [phone]        [facility]  MAIN REHAB SERVICES Follow up.   Specialty:  Rehabilitation Why:  They will contact you for the first appointment. Contact information: [address] [id number] [address] [place] [address] [phone]          Hospital Course by problem list: Principal Problem:   CVA (cerebral vascular accident) [facility]) Active Problems:   Mobitz type 1 second degree atrioventricular block   Tyler Gomez is a 61 yo male who presented with a 7 hour history of intermittent, self resolving episodes of LLE weakness and possible stuttering of his speech. He arrived to the ED as a code stroke on 07/06/2017. He was admitted to the internal medicine teaching service with neurology consulting. The specific problems addressed during his admission are as follows:  Acute ischemic stroke of Right ACA: The patient's initial CT head did not show areas concerning for hemorrhagic stroke and CT angio which showed mild right and moderate left ICA stenosis, with decreased R2 ACA signal attenuation and subsequent absent signal in distal branch vessels. His brain MRI showed areas of ischemia consistent with R ACA occlusion, suggesting acute stroke. He was given aspirin and plavix for treatment of ischemic stroke. He was instructed to continue dual antiplatelet therapy for 3 weeks (aspiring 325 mg, clopidogrel 75 mg daily). After 3 weeks time he will transition to aspirin only.   His A1C was found to be 5.9% on admission and LDL was 91, suggesting HLD or DM were not risk factors for development of stroke. He was started on Lipitor 40 mg for prevention and counseled on diet/exercise for management of pre-diabetes. His stroke was thought to be 2/2 to cardioembolic disease and possible underlying arrhythmia, as the patient was observed to have 2 degree AV block, mobitz type 1 on EKG during presentation. Patient will continue workup as an outpatient, which will include transesophageal echocardiography and continuous  holter monitor to assess for arrythemia. He will follow up with the neurology stroke clinic in 6 weeks after these studies are complete. The patient's L sided deficits were essentially resolved on morning of discharge, however the patient was given instructions to follow up with PT as an outpatient to prevent long term L sided weakness.   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 The patient will need neurosurgery follow up as an outpatient to discuss surgical options.   Discharge Vitals:   BP 116/69 (BP Location: Left Arm)   Pulse 70   Temp 98.3 F (36.8 C) (Oral)   Resp 16   Ht 6' (1.829 m)   Wt 197 lb 1.6 oz (89.4 kg)   SpO2 100%   BMI 26.73 kg/m   Pertinent Labs, Studies, and Procedures:   BMP    Component Value Date/Time   NA 139 07/05/2017 1031   NA 143 08/28/2015 1158   NA 133 (L) 11/30/2013 1125   K 3.2 (L) 07/05/2017 1031   K 2.9 (L) 11/30/2013 1125   CL 105 07/05/2017 1031   CL  98 11/30/2013 1125   CO2 26 07/05/2017 1010   CO2 23 11/30/2013 1125   GLUCOSE 145 (H) 07/05/2017 1031   GLUCOSE 109 (H) 11/30/2013 1125   BUN 14 07/05/2017 1031   BUN 8 08/28/2015 1158   BUN 16 11/30/2013 1125   CREATININE 1.00 07/05/2017 1031   CREATININE 2.02 (H) 11/30/2013 1125   CALCIUM 9.3 07/05/2017 1010   CALCIUM 8.7 11/30/2013 1125   GFRNONAA >60 07/05/2017 1010   GFRNONAA 35 (L) 11/30/2013 1125   GFRAA >60 07/05/2017 1010   GFRAA 41 (L) 11/30/2013 1125   CBC    Component Value Date/Time   WBC 4.5 07/05/2017 1010   RBC 4.62 07/05/2017 1010   HGB 14.3 07/05/2017 1031   HGB 14.1 11/30/2013 1125   HCT 42.0 07/05/2017 1031   HCT 42.9 11/30/2013 1125   PLT 239 07/05/2017 1010   PLT 216 11/30/2013 1125   MCV 84.6 07/05/2017 1010   MCV 83 11/30/2013 1125   MCH 27.7 07/05/2017 1010   MCHC 32.7 07/05/2017 1010   RDW 13.5 07/05/2017 1010   RDW  14.0 11/30/2013 1125   LYMPHSABS 0.8 07/05/2017 1010   MONOABS 0.4 07/05/2017 1010   EOSABS 0.1 07/05/2017 1010   BASOSABS 0.0 07/05/2017 1010   Lipid Panel     Component Value Date/Time   CHOL 135 07/06/2017 0502   CHOL 185 08/28/2015 1158   TRIG 79 07/06/2017 0502   HDL 28 (L) 07/06/2017 0502   HDL 34 (L) 08/28/2015 1158   CHOLHDL 4.8 07/06/2017 0502   VLDL 16 07/06/2017 0502   LDLCALC 91 07/06/2017 0502   LDLCALC 120 (H) 08/28/2015 1158   Hemoglobin A1C = 5.9%  Impression from Brain MRI: IMPRESSION: 1. Small acute right ACA infarct. 2. 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 Mild mass effect on the optic chiasm.  Impression from CT Angio Head and Neck: 1. No emergent large vessel occlusion. 2. Intracranial atherosclerosis with mild right and moderate left ICA stenoses and diffuse branch vessel irregularity. 3. Right A2 attenuation with missing right ACA branch vessels. 4. Diminutive posterior circulation with mild right and severe left V4 stenoses and mild basilar narrowing. 5. Posterior right clinoid and sellar mass which does not reflect an aneurysm. An MRI is pending for further evaluation. 6. Widely patent cervical carotid arteries.  Impression from CT head Code Stroke: IMPRESSION: 1. No evidence of acute infarct or hemorrhage. 2. ASPECTS is 10. 3. 2 cm sellar and suprasellar mass versus aneurysm. CTA is pending.  Discharge Instructions: Discharge Instructions    Ambulatory referral to Neurology    Complete by:  As directed    An appointment is requested in approximately: 6 weeks Stroke patient   Ambulatory referral to Occupational Therapy    Complete by:  As directed    Ambulatory referral to Physical Therapy    Complete by:  As directed    Call MD for:  difficulty breathing, headache or visual disturbances    Complete by:  As directed    Call MD for:  persistant dizziness or light-headedness     Complete by:  As directed    Call MD for:  persistant nausea and vomiting    Complete by:  As directed    Call MD for:  temperature >100.4    Complete by:  As directed    Diet - low sodium heart healthy    Complete by:  As directed    Discharge  instructions    Complete by:  As directed    You were treated in the hospital for an acute stroke, which caused your left sided upper and lower extremity weakness. Please follow up with the neurologist who visited you in the hospital. They would like to see you in 6 weeks. Please use the information provided on these instructions to ensure you see your neurologist at the appropriate time. Prior to seeing them, they would like you to have additional tests complete so they can review the results of these tests with you at your follow up visit. You will receive a call from a cardiologist after discharge to schedule a transesophageal echocardiogram and a continuous loop monitor. If you do not hear from them within the next two weeks, please call [facility] at [phone] to schedule your tests.   In the meantime you will need to take Aspirin 325 mg daily along with Plavix 75 mg daily. You will take both of these medications for 3 weeks. After 3 weeks you will take only Aspirin 325 mg daily. You will also need to take a medication, Lipitor, to lower your cholesterol. You will have to take one 40 mg tablet of Lipitor daily.   You should follow up with your primary care doctor regarding your medications and a prescription for your daily aspirin and lipitor. You should also follow up with them regarding this hospitalization and management of your chronic medical conditions. Please follow up with them in 1 week.   Increase activity slowly    Complete by:  As directed       Signed: Rozann Lesches, MD 07/06/2017, 6:54 PM   Pager: [phone]

## 2017-07-06 NOTE — Progress Notes (Signed)
STROKE TEAM PROGRESS NOTE   HISTORY OF PRESENT ILLNESS (per record) Tyler Gomez is a 61 y.o. male with a history of hypertension who presents with left-sided weakness which started sometime around 9 AM. Following his initial left sided weakness, he had significant improvement but then en route he again worsened. On arrival, he still had some left-sided weakness but this again rapidly improved to an NIH of 0.  He states that something similar happened 2 weeks ago. Of note, he was also bradycardic during his episode with pulses down into the 30s.  LKW: 9 AM tpa given?: no, resolution of symptoms    SUBJECTIVE (INTERVAL HISTORY) His  family is at the bedside.   Patient states he had transient left leg weakness happening twice within a short period of time with full recovery. He denies any prior history of strokes or TIAs. He was not on any antiplatelet therapy prior to admission. He has no significant vascular risk factors except hypertension   OBJECTIVE Temp:  [98.2 F (36.8 C)-98.7 F (37.1 C)] 98.2 F (36.8 C) (10/01 1010) Pulse Rate:  [61-80] 80 (10/01 1010) Cardiac Rhythm: Heart block (10/01 0700) Resp:  [10-18] 16 (10/01 1010) BP: (100-126)/(62-83) 122/72 (10/01 1010) SpO2:  [97 %-100 %] 100 % (10/01 1010) Weight:  [197 lb 1.6 oz (89.4 kg)] 197 lb 1.6 oz (89.4 kg) (09/30 2026)  CBC:   Recent Labs Lab 07/05/17 1010 07/05/17 1031  WBC 4.5  --   NEUTROABS 3.1  --   HGB 12.8* 14.3  HCT 39.1 42.0  MCV 84.6  --   PLT 239  --     Basic Metabolic Panel:   Recent Labs Lab 07/05/17 1010 07/05/17 1031  NA 136 139  K 3.2* 3.2*  CL 102 105  CO2 26  --   GLUCOSE 146* 145*  BUN 12 14  CREATININE 1.10 1.00  CALCIUM 9.3  --     Lipid Panel:     Component Value Date/Time   CHOL 135 07/06/2017 0502   CHOL 185 08/28/2015 1158   TRIG 79 07/06/2017 0502   HDL 28 (L) 07/06/2017 0502   HDL 34 (L) 08/28/2015 1158   CHOLHDL 4.8 07/06/2017 0502   VLDL 16  07/06/2017 0502   LDLCALC 91 07/06/2017 0502   LDLCALC 120 (H) 08/28/2015 1158   HgbA1c:  Lab Results  Component Value Date   HGBA1C 5.9 (H) 07/06/2017   Urine Drug Screen: No results found for: LABOPIA, COCAINSCRNUR, LABBENZ, AMPHETMU, THCU, LABBARB  Alcohol Level No results found for: ETH  IMAGING  Ct Angio Head W Or Gomez Contrast Ct Angio Neck W Or Gomez Contrast 07/05/2017 IMPRESSION:  1. No emergent large vessel occlusion.  2. Intracranial atherosclerosis with mild right and moderate left ICA stenoses and diffuse branch vessel irregularity.  3. Right A2 attenuation with missing right ACA branch vessels.  4. Diminutive posterior circulation with mild right and severe left V4 stenoses and mild basilar narrowing.  5. Posterior right clinoid and sellar mass which does not reflect an aneurysm. An MRI is pending for further evaluation.  6. Widely patent cervical carotid arteries.    Mr Tyler Gomez Contrast 07/05/2017 IMPRESSION:  1. Small acute right ACA infarct.  2. 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 Mild mass effect on the optic chiasm.    Ct Head Code Stroke Gomez Contrast 07/05/2017 IMPRESSION:  1. No evidence of acute infarct or hemorrhage.  2. ASPECTS  is 10.  3. 2 cm sellar and suprasellar mass versus aneurysm. CTA is pending.      Transthoracic Echocardiogram - pending     PHYSICAL EXAM  Vitals:   07/05/17 2218 07/06/17 0118 07/06/17 0510 07/06/17 1010  BP: 106/66 108/62 115/63 122/72  Pulse: 67 61 65 80  Resp: Temp: 98.7 F (37.1 C) 98.6 F (37 C) 98.4 F (36.9 C) 98.2 F (36.8 C)  TempSrc: Oral Oral Oral Oral  SpO2: 98% 99% 97% 100%  Weight:      Height:      pleasant middle aged male not in distress. . Afebrile. Head is nontraumatic. Neck is supple without bruit.    Cardiac exam no murmur or gallop. Lungs are clear to auscultation. Distal pulses are well felt.  Neurological Exam ;   Awake  Alert oriented x 3. Normal speech and language.eye movements full without nystagmus.fundi were not visualized. Vision acuity and fields appear normal. Hearing is normal. Palatal movements are normal. Face symmetric. Tongue midline. Normal strength, tone, reflexes and coordination. Normal sensation. Gait deferred.    ASSESSMENT/PLAN [name] is a 61 y.o. male with history of hypertension presenting with transient left-sided weakness. He did not receive IV t-PA due to resolution of deficits.  Stroke: Small acute right ACA infarct.   Resultant  No deficits  CT head - No evidence of acute infarct or hemorrhage.  MRI head - Small acute right ACA infarct.   MRA head - not performed  CTA H&N - mod Lt ICA stenoses. Severe Lt V4 stenoses. Rt A2 attenuation with missing Rt ACA branch vessels.   Carotid Doppler - CTA neck  2D Echo - pending  LDL - 91  HgbA1c - 5.9  VTE prophylaxis - Lovenox Diet Heart Room service appropriate? Yes; Fluid consistency: Thin  No antithrombotic prior to admission, now on aspirin 325 mg daily and clopidogrel 75 mg daily for 3 weeks followed by aspirin alone ( based on [employer] trials )  Patient counseled to be compliant with his antithrombotic medications  Ongoing aggressive stroke risk factor management  Therapy recommendations: pending  Disposition: Pending  Hypertension  Blood pressure runs mildly low - blood pressure medications prior to admission - currently on hold.  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   Hyperglycemia - hemoglobin A1c 5.9    Hospital day # 1  I have personally examined this patient, reviewed notes, independently viewed imaging studies, participated in medical decision making and plan of care.ROS completed by me personally and pertinent positives fully documented  I have made any additions or clarifications directly to the above note.  The patient presented with small right anterior cerebral artery branch infarct of embolic etiology source to be determined. Recommend outpatient transesophageal echocardiogram and prolonged cardiac monitoring with loop recorder. Recommend dual antiplatelet therapy for 3 weeks followed by aspirin alone. Outpatient neurosurgery  referral for suprasellar mass which may be craniopharyngioma or Rathke's cleft cyst. Discussed with patient and Dr. Sandre Kitty. Greater than 50% time during this 35 minute visit was spent on counseling and coordination of care about his embolic stroke, discussion about evaluation, treatment and prevention plan and answered questions.Delia Heady, MD Medical Director [facility] Pager: [phone] 07/06/2017 3:05 PM   To contact Stroke Continuity provider, please refer to [email or web address]. After hours, contact General Neurology

## 2017-07-06 NOTE — Progress Notes (Signed)
OT Evaluation - G-code Entry    07/06/17 1300  OT Time Calculation  OT Start Time (ACUTE ONLY) 1135  OT Stop Time (ACUTE ONLY) 1152  OT Time Calculation (min) 17 min  OT G-codes **NOT FOR INPATIENT CLASS**  Functional Assessment Tool Used Clinical judgement  Functional Limitation Self care  Self Care Current Status (Z6109) CI  Self Care Goal Status (U0454) CH   Audree Schrecengost MSOT, OTR/L Acute Rehab Pager: [phone] Office: [phone]

## 2017-07-06 NOTE — Care Management Note (Signed)
Case Management Note  Patient Details  Name: Tyler Gomez MRN: [id number] Date of Birth: [birth year]  Subjective/Objective:    Pt admitted with CVA. He is from home with his spouse.                 Action/Plan: PT recommending Outpatient therapy. Await OT eval. CM following for d/c needs, physician orders.  Expected Discharge Date:  07/06/17               Expected Discharge Plan:  OP Rehab  In-House Referral:     Discharge planning Services  CM Consult  Post Acute Care Choice:    Choice offered to:     DME Arranged:    DME Agency:     HH Arranged:    HH Agency:     Status of Service:  In process, will continue to follow  If discussed at Long Length of Stay Meetings, dates discussed:    Additional Comments:  Kermit Balo, RN 07/06/2017, 12:38 PM

## 2017-07-06 NOTE — Progress Notes (Signed)
   07/06/17 1000  Clinical Encounter Type  Visited With Patient  Visit Type Follow-up  Referral From Chaplain;Nurse  Consult/Referral To Chaplain  Spiritual Encounters  Spiritual Needs Emotional  Stress Factors  Patient Stress Factors Health changes  Family Stress Factors None identified  Conducted a brief introduction with the patient.  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  The patients primary concern is going home and he does not appear to be overly concerned about his health.  Chaplain will follow up with patient later today if he is still in the hospital.

## 2017-07-06 NOTE — Evaluation (Signed)
Physical Therapy Evaluation Patient Details Name: Tyler Gomez MRN: [id number] DOB: Oct 01, 1956 Today's Date: 07/06/2017   History of Present Illness  61 year old male who presented to the ED following an acute episode of left sided weakness and brief alteration in mentation. MRI showing small R ACA infarct and 2cm sellar and suprasellar mass.   Clinical Impression  Pt admitted with above diagnosis. Pt currently with functional limitations due to the deficits listed below (see PT Problem List). At the time of PT eval pt was able to perform transfers and ambulation with gross modified independence. Pt demonstrated some difficulty completing head turns (vertical appeared more difficult than horizontal). Pt will benefit from skilled PT to increase their independence and safety with mobility to allow discharge to the venue listed below.      Follow Up Recommendations Outpatient Neuro PT;Supervision - Intermittent    Equipment Recommendations  None recommended by PT    Recommendations for Other Services       Precautions / Restrictions Precautions Precautions: Fall Restrictions Weight Bearing Restrictions: No      Mobility  Bed Mobility               General bed mobility comments: Pt sitting up in recliner upon PT arrival.   Transfers Overall transfer level: Modified independent Equipment used: None Transfers: Sit to/from Stand           General transfer comment: Pt demonstrated proper hand placement on seated surface for safety. No assist required.   Ambulation/Gait Ambulation/Gait assistance: Supervision Ambulation Distance (Feet): 400 Feet Assistive device: None Gait Pattern/deviations: Step-through pattern;Decreased stride length Gait velocity: Decreased Gait velocity interpretation: Below normal speed for age/gender General Gait Details: Pt ambulating fairly well with no obvious unsteadiness or LOB noted. Appears somewhat slowed with pace but states he  usually wears shoes when he walks.   Stairs Stairs: Yes Stairs assistance: Modified independent (Device/Increase time) Stair Management: No rails;Alternating pattern;Forwards Number of Stairs: 2 General stair comments: No assist required. Pt able to complete without difficulty.   Wheelchair Mobility    Modified Rankin (Stroke Patients Only) Modified Rankin (Stroke Patients Only) Pre-Morbid Rankin Score: No symptoms Modified Rankin: No significant disability     Balance Overall balance assessment: Needs assistance                               Standardized Balance Assessment Standardized Balance Assessment : Dynamic Gait Index   Dynamic Gait Index Level Surface: Normal Gait with Horizontal Head Turns: Mild Impairment Gait with Vertical Head Turns: Severe Impairment Gait and Pivot Turn: Normal Step Over Obstacle: Normal Steps: Normal       Pertinent Vitals/Pain Pain Assessment: No/denies pain    Home Living Family/patient expects to be discharged to:: Private residence Living Arrangements: Spouse/significant other Available Help at Discharge: Family;Available 24 hours/day Type of Home: House Home Access: Level entry     Home Layout: One level Home Equipment: None      Prior Function Level of Independence: Independent         Comments: Work full time Engineer, [employer]   Dominant Hand: Right    Extremity/Trunk Assessment   Upper Extremity Assessment Upper Extremity Assessment: Defer to OT evaluation    Lower Extremity Assessment Lower Extremity Assessment: Overall WFL for tasks assessed (5/5 strength, pt reports no N/T or sensation differences)    Cervical / Trunk Assessment Cervical / Trunk  Assessment: Normal  Communication   Communication: No difficulties  Cognition Arousal/Alertness: Awake/alert Behavior During Therapy: WFL for tasks assessed/performed Overall Cognitive Status: Within Functional Limits  for tasks assessed                                 General Comments: A/O x4 - will defer to OT for higher level cognitive testing      General Comments      Exercises     Assessment/Plan    PT Assessment Patient needs continued PT services  PT Problem List Decreased activity tolerance;Decreased balance;Decreased cognition       PT Treatment Interventions DME instruction;Gait training;Stair training;Functional mobility training;Therapeutic activities;Therapeutic exercise;Neuromuscular re-education;Patient/family education;Cognitive remediation    PT Goals (Current goals can be found in the Care Plan section)  Acute Rehab PT Goals Patient Stated Goal: Home today PT Goal Formulation: With patient Time For Goal Achievement: 07/13/17 Potential to Achieve Goals: Good    Frequency Min 3X/week   Barriers to discharge        Co-evaluation               AM-PAC PT "6 Clicks" Daily Activity  Outcome Measure Difficulty turning over in bed (including adjusting bedclothes, sheets and blankets)?: None Difficulty moving from lying on back to sitting on the side of the bed? : None Difficulty sitting down on and standing up from a chair with arms (e.g., wheelchair, bedside commode, etc,.)?: None Help needed moving to and from a bed to chair (including a wheelchair)?: None Help needed walking in hospital room?: None Help needed climbing 3-5 steps with a railing? : None 6 Click Score: 24    End of Session Equipment Utilized During Treatment: Gait belt Activity Tolerance: Patient tolerated treatment well Patient left: Other (comment) (With OT in therapy gym) Nurse Communication: Mobility status PT Visit Diagnosis: Unsteadiness on feet (R26.81);Other symptoms and signs involving the nervous system (R29.898)    Time: 1610-9604 PT Time Calculation (min) (ACUTE ONLY): 29 min   Charges:   PT Evaluation $PT Eval Moderate Complexity: 1 Mod PT Treatments $Gait  Training: 8-22 mins   PT G Codes:        Conni Slipper, PT, DPT Acute Rehabilitation Services Pager: [phone]   Marylynn Pearson 07/06/2017, 12:00 PM

## 2017-07-06 NOTE — Care Management Note (Addendum)
Case Management Note  Patient Details  Name: Tyler Gomez MRN: [id number] Date of Birth: [birth year]  Subjective/Objective:                    Action/Plan: Pt discharging home with self care. CM consulted for outpatient therapy. CM met with the patient and his wife and they would like to attend at [facility]. Orders placed in EPIC and information on the AVS. Patients wife to provide transportation home.   Expected Discharge Date:  07/06/17               Expected Discharge Plan:  OP Rehab  In-House Referral:     Discharge planning Services  CM Consult  Post Acute Care Choice:    Choice offered to:     DME Arranged:    DME Agency:     HH Arranged:    HH Agency:     Status of Service:  Completed, signed off  If discussed at [employer] of Stay Meetings, dates discussed:    Additional Comments:  Pollie Friar, RN 07/06/2017, 4:26 PM

## 2017-07-06 NOTE — Progress Notes (Signed)
Subjective: Patient was seen working with PT this AM and sitting comfortably in his chair. He did not endorse any left sided weakness or trouble walking around this AM. He denies headaches and changes in vision. He states that he feels ready to go home today.   Objective:  Vital signs in last 24 hours: Vitals:   07/05/17 2218 07/06/17 0118 07/06/17 0510 07/06/17 1010  BP: 106/66 108/62 115/63 122/72  Pulse: 67 61 65 80  Resp: Temp: 98.7 F (37.1 C) 98.6 F (37 C) 98.4 F (36.9 C) 98.2 F (36.8 C)  TempSrc: Oral Oral Oral Oral  SpO2: 98% 99% 97% 100%  Weight:      Height:       Physical Exam  Constitutional: He is oriented to person, place, and time. He appears well-developed and well-nourished. No distress.  HENT:  Mouth/Throat: Oropharynx is clear and moist. No oropharyngeal exudate.  Eyes: Pupils are equal, round, and reactive to light. Conjunctivae and EOM are normal.  Cardiovascular: Normal rate, regular rhythm and intact distal pulses.  Exam reveals no friction rub.   No murmur heard. Pulmonary/Chest: Effort normal. No respiratory distress. He has no wheezes.  Musculoskeletal: He exhibits no edema (of bilateral lower extremities) or tenderness (of bilateral lower extremities).  Neurological: He is alert and oriented to person, place, and time.  Face strength and sensation intact bilaterally. Tongue midline. 5/5 bicep, tricep, and grip strength bilaterally. 5/5 hip flexion, dorsiflexion, and plantarflexion bilaterally. Gross sensation to light touch of upper and lower extremities intact bilaterally.  Skin: Skin is warm and dry. Capillary refill takes less than 2 seconds. No rash noted. No erythema.   Assessment/Plan:  Principal Problem:   CVA (cerebral vascular accident) [facility]) Active Problems:   Mobitz type 1 second degree atrioventricular block  Tyler Gomez is a 61 yo male who presented with a 7 hour history of intermittent, self resolving  episodes of LLE weakness and possible stuttering of his speech. He arrived to the ED as a code stroke on 07/06/2017. He was admitted to the internal medicine teaching service with neurology consulting. The specific problems addressed during his admission are as follows:  Acute ischemic stroke of Right ACA: The patient's initial CT head did not show areas concerning for hemorrhagic stroke and CT angio which showed mild right and moderate left ICA stenosis, with decreased R2 ACA signal attenuation with absent signal in distal branch vessels. His brain MRI showed areas of ischemia consistent with R ACA occlusion, suggesting acute stroke. His workup thus far has been unremarkable, but it is thought that his stroke was 2/2 to cardioembolic disease and possible underlying arrhythmia. Patient will continue workup as an outpatient with neurology stroke clinic.  -Neurology consulted, appreciate recommendations -Aspirin 325 mg daily and clopidogrel 75 mg daily x 3 weeks; discontinue clopidogrel after 3 weeks -Hgb A1C = 5.9%, dietary and lifestyle modifications needed - LDL = 91, HDL = 28, start Lipitor 40 mg daily on discharge -Transesophageal echo with continuous loop monitor for arrhythmia as an outpatient -Patient will follow up with stroke clinic in 6 weeks , 2 degree AV block, Mobitz type 1: Noted on initial EKG in ED, without evidence of this block on repeat EKG or overnight telemetry. Patient asymptomatic and no intervention required.  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  Dispo: Anticipated discharge in approximately 0-1 day.   Compton Brigance,  Jeanella Flattery, MD 07/06/2017, 1:52 PM Pager: [phone]

## 2017-07-07 ENCOUNTER — Other Ambulatory Visit (HOSPITAL_COMMUNITY): Payer: Managed Care, Other (non HMO)

## 2017-07-08 ENCOUNTER — Inpatient Hospital Stay (HOSPITAL_COMMUNITY)
Admission: EM | Admit: 2017-07-08 | Discharge: 2017-07-10 | DRG: 040 | Disposition: A | Discharge status: Home / Self Care | Payer: Managed Care, Other (non HMO) | Attending: Internal Medicine | Admitting: Internal Medicine

## 2017-07-08 ENCOUNTER — Encounter (HOSPITAL_COMMUNITY): Payer: Self-pay | Admitting: Emergency Medicine

## 2017-07-08 ENCOUNTER — Emergency Department (HOSPITAL_COMMUNITY): Payer: Managed Care, Other (non HMO)

## 2017-07-08 DIAGNOSIS — R55 Syncope and collapse: Secondary | ICD-10-CM | POA: Diagnosis present

## 2017-07-08 DIAGNOSIS — I639 Cerebral infarction, unspecified: Secondary | ICD-10-CM | POA: Diagnosis present

## 2017-07-08 DIAGNOSIS — I441 Atrioventricular block, second degree: Secondary | ICD-10-CM | POA: Diagnosis present

## 2017-07-08 DIAGNOSIS — I440 Atrioventricular block, first degree: Secondary | ICD-10-CM | POA: Diagnosis not present

## 2017-07-08 DIAGNOSIS — Z79899 Other long term (current) drug therapy: Secondary | ICD-10-CM

## 2017-07-08 DIAGNOSIS — E663 Overweight: Secondary | ICD-10-CM | POA: Diagnosis present

## 2017-07-08 DIAGNOSIS — Z8249 Family history of ischemic heart disease and other diseases of the circulatory system: Secondary | ICD-10-CM

## 2017-07-08 DIAGNOSIS — I69354 Hemiplegia and hemiparesis following cerebral infarction affecting left non-dominant side: Secondary | ICD-10-CM | POA: Diagnosis not present

## 2017-07-08 DIAGNOSIS — Z713 Dietary counseling and surveillance: Secondary | ICD-10-CM

## 2017-07-08 DIAGNOSIS — Z7951 Long term (current) use of inhaled steroids: Secondary | ICD-10-CM

## 2017-07-08 DIAGNOSIS — Z7902 Long term (current) use of antithrombotics/antiplatelets: Secondary | ICD-10-CM

## 2017-07-08 DIAGNOSIS — I959 Hypotension, unspecified: Secondary | ICD-10-CM | POA: Diagnosis present

## 2017-07-08 DIAGNOSIS — W1830XA Fall on same level, unspecified, initial encounter: Secondary | ICD-10-CM | POA: Diagnosis present

## 2017-07-08 DIAGNOSIS — I6389 Other cerebral infarction: Secondary | ICD-10-CM | POA: Diagnosis not present

## 2017-07-08 DIAGNOSIS — R0602 Shortness of breath: Secondary | ICD-10-CM | POA: Diagnosis present

## 2017-07-08 DIAGNOSIS — E785 Hyperlipidemia, unspecified: Secondary | ICD-10-CM | POA: Diagnosis present

## 2017-07-08 DIAGNOSIS — Z7982 Long term (current) use of aspirin: Secondary | ICD-10-CM | POA: Diagnosis not present

## 2017-07-08 DIAGNOSIS — Z6827 Body mass index (BMI) 27.0-27.9, adult: Secondary | ICD-10-CM

## 2017-07-08 DIAGNOSIS — Z8673 Personal history of transient ischemic attack (TIA), and cerebral infarction without residual deficits: Secondary | ICD-10-CM

## 2017-07-08 DIAGNOSIS — I63521 Cerebral infarction due to unspecified occlusion or stenosis of right anterior cerebral artery: Principal | ICD-10-CM | POA: Diagnosis present

## 2017-07-08 DIAGNOSIS — R531 Weakness: Secondary | ICD-10-CM | POA: Diagnosis not present

## 2017-07-08 DIAGNOSIS — S066X9A Traumatic subarachnoid hemorrhage with loss of consciousness of unspecified duration, initial encounter: Secondary | ICD-10-CM | POA: Diagnosis present

## 2017-07-08 DIAGNOSIS — I10 Essential (primary) hypertension: Secondary | ICD-10-CM | POA: Diagnosis present

## 2017-07-08 DIAGNOSIS — R29700 NIHSS score 0: Secondary | ICD-10-CM | POA: Diagnosis present

## 2017-07-08 DIAGNOSIS — E119 Type 2 diabetes mellitus without complications: Secondary | ICD-10-CM | POA: Diagnosis present

## 2017-07-08 DIAGNOSIS — W19XXXA Unspecified fall, initial encounter: Secondary | ICD-10-CM | POA: Diagnosis not present

## 2017-07-08 HISTORY — DX: Syncope and collapse: R55

## 2017-07-08 HISTORY — DX: Cerebral infarction, unspecified: I63.9

## 2017-07-08 HISTORY — DX: Personal history of transient ischemic attack (TIA), and cerebral infarction without residual deficits: Z86.73

## 2017-07-08 LAB — COMPREHENSIVE METABOLIC PANEL
ALT: 39 U/L (ref 17–63)
ANION GAP: 9 (ref 5–15)
AST: 43 U/L — ABNORMAL HIGH (ref 15–41)
Albumin: 3.7 g/dL (ref 3.5–5.0)
Alkaline Phosphatase: 47 U/L (ref 38–126)
BUN: 10 mg/dL (ref 6–20)
CHLORIDE: 104 mmol/L (ref 101–111)
CO2: 23 mmol/L (ref 22–32)
Calcium: 9 mg/dL (ref 8.9–10.3)
Creatinine, Ser: 1.08 mg/dL (ref 0.61–1.24)
Glucose, Bld: 101 mg/dL — ABNORMAL HIGH (ref 65–99)
Potassium: 3.4 mmol/L — ABNORMAL LOW (ref 3.5–5.1)
SODIUM: 136 mmol/L (ref 135–145)
Total Bilirubin: 0.8 mg/dL (ref 0.3–1.2)
Total Protein: 8.1 g/dL (ref 6.5–8.1)

## 2017-07-08 LAB — CBC WITH DIFFERENTIAL/PLATELET
BASOS ABS: 0 10*3/uL (ref 0.0–0.1)
Basophils Relative: 0 %
EOS PCT: 1 %
Eosinophils Absolute: 0 10*3/uL (ref 0.0–0.7)
HCT: 37.5 % — ABNORMAL LOW (ref 39.0–52.0)
HEMOGLOBIN: 12.1 g/dL — AB (ref 13.0–17.0)
LYMPHS ABS: 0.9 10*3/uL (ref 0.7–4.0)
LYMPHS PCT: 15 %
MCH: 27.6 pg (ref 26.0–34.0)
MCHC: 32.3 g/dL (ref 30.0–36.0)
MCV: 85.4 fL (ref 78.0–100.0)
Monocytes Absolute: 0.4 10*3/uL (ref 0.1–1.0)
Monocytes Relative: 7 %
NEUTROS PCT: 77 %
Neutro Abs: 4.4 10*3/uL (ref 1.7–7.7)
PLATELETS: 216 10*3/uL (ref 150–400)
RBC: 4.39 MIL/uL (ref 4.22–5.81)
RDW: 13.1 % (ref 11.5–15.5)
WBC: 5.7 10*3/uL (ref 4.0–10.5)

## 2017-07-08 LAB — APTT: APTT: 26 s (ref 24–36)

## 2017-07-08 LAB — I-STAT CHEM 8, ED
BUN: 11 mg/dL (ref 6–20)
CHLORIDE: 101 mmol/L (ref 101–111)
Calcium, Ion: 1.11 mmol/L — ABNORMAL LOW (ref 1.15–1.40)
Creatinine, Ser: 0.9 mg/dL (ref 0.61–1.24)
GLUCOSE: 101 mg/dL — AB (ref 65–99)
HEMATOCRIT: 41 % (ref 39.0–52.0)
HEMOGLOBIN: 13.9 g/dL (ref 13.0–17.0)
POTASSIUM: 3.1 mmol/L — AB (ref 3.5–5.1)
SODIUM: 139 mmol/L (ref 135–145)
TCO2: 23 mmol/L (ref 22–32)

## 2017-07-08 LAB — PROTIME-INR
INR: 1.12
PROTHROMBIN TIME: 14.3 s (ref 11.4–15.2)

## 2017-07-08 LAB — I-STAT TROPONIN, ED: Troponin i, poc: 0.07 ng/mL (ref 0.00–0.08)

## 2017-07-08 LAB — CBG MONITORING, ED: GLUCOSE-CAPILLARY: 99 mg/dL (ref 65–99)

## 2017-07-08 MED ORDER — SODIUM CHLORIDE 0.9 % IV SOLN
INTRAVENOUS | Status: DC
  Administered 2017-07-08: 22:00:00 via INTRAVENOUS

## 2017-07-08 MED ORDER — ACETAMINOPHEN 650 MG RE SUPP: 650.0000 mg | RECTAL | Status: DC | PRN

## 2017-07-08 MED ORDER — ENOXAPARIN SODIUM 40 MG/0.4ML ~~LOC~~ SOLN
40.0000 mg | SUBCUTANEOUS | Status: DC
Start: 2017-07-08 — End: 2017-07-09
  Administered 2017-07-08: 40 mg via SUBCUTANEOUS
  Filled 2017-07-08: qty 0.4

## 2017-07-08 MED ORDER — CLOPIDOGREL BISULFATE 75 MG PO TABS
75.0000 mg | ORAL_TABLET | Freq: Every day | ORAL | Status: DC
  Administered 2017-07-09: 75 mg via ORAL
  Filled 2017-07-08: qty 1

## 2017-07-08 MED ORDER — ACETAMINOPHEN 160 MG/5ML PO SOLN: 650.0000 mg | ORAL | Status: DC | PRN

## 2017-07-08 MED ORDER — ASPIRIN EC 325 MG PO TBEC: 325.0000 mg | DELAYED_RELEASE_TABLET | Freq: Every day | ORAL | Status: DC

## 2017-07-08 MED ORDER — ATORVASTATIN CALCIUM 40 MG PO TABS
40.0000 mg | ORAL_TABLET | Freq: Every day | ORAL | Status: DC
  Administered 2017-07-09 – 2017-07-10 (×2): 40 mg via ORAL
  Filled 2017-07-08 (×2): qty 1

## 2017-07-08 MED ORDER — SENNOSIDES-DOCUSATE SODIUM 8.6-50 MG PO TABS: 1.0000 | ORAL_TABLET | Freq: Every evening | ORAL | Status: DC | PRN

## 2017-07-08 MED ORDER — POTASSIUM CHLORIDE CRYS ER 20 MEQ PO TBCR
40.0000 meq | EXTENDED_RELEASE_TABLET | Freq: Once | ORAL | Status: AC
  Administered 2017-07-08: 40 meq via ORAL
  Filled 2017-07-08: qty 2

## 2017-07-08 MED ORDER — STROKE: EARLY STAGES OF RECOVERY BOOK
Freq: Once | Status: AC
  Administered 2017-07-08: 21:00:00

## 2017-07-08 MED ORDER — ACETAMINOPHEN 325 MG PO TABS: 650.0000 mg | ORAL_TABLET | ORAL | Status: DC | PRN

## 2017-07-08 MED ORDER — ASPIRIN EC 325 MG PO TBEC
325.0000 mg | DELAYED_RELEASE_TABLET | Freq: Every day | ORAL | Status: DC
  Administered 2017-07-09: 325 mg via ORAL
  Filled 2017-07-08: qty 1

## 2017-07-08 MED ORDER — SODIUM CHLORIDE 0.9 % IV SOLN
INTRAVENOUS | Status: DC
Start: 2017-07-08 — End: 2017-07-08
  Administered 2017-07-08 (×2): via INTRAVENOUS

## 2017-07-08 NOTE — ED Notes (Signed)
Patient transported to X-ray 

## 2017-07-08 NOTE — H&P (Signed)
Date: 07/08/2017               Patient Name:  Tyler Gomez MRN: [id number]  DOB: March 18, 1956 Age / Sex: 61 y.o., male   PCP: Ellyn Hack, MD         Medical Service: Internal Medicine Teaching Service         Attending Physician: Dr. Sandre Kitty Elwin Mocha, MD    First Contact: Dr. Rozann Lesches Pager: [phone]  Second Contact: Dr. Geralyn Corwin Pager: [phone]       After Hours (After 5p/  First Contact Pager: [phone]  weekends / holidays): Second Contact Pager: [phone]   Chief Complaint: Syncope  History of Present Illness:  Tyler Gomez is a 61 yo who presented on 10/3 after an episode of syncope and LLE weakness while at work. The patient states that he was in usual state of health this AM. Prior to presentation he completed his usual morning routine of 40 push-ups x 2 rounds, ate cheerios for breakfast, took all medications as prescribed, and drove himself to work. At work, he stood up in one place for about 3 hours. He all of the sudden felt weakness in his left lower extremity, similar to how he felt on Sunday when he was admitted to the hospital for acute stroke. The patient states that he then attempted to kneel to the floor to resolve his lower extremity weakness. He then "blacked out" and fell to the ground. He endorsed hitting his head. Denied headache prior to his fall. Patient states he was diaphoretic when he woke up and endorsed feeling cold just before he fell. Patient states that leading up to his presentation he had been eating and drinking as per usual. His wife, who is bedside, notes that his urine has recently smelt "funny" but patient denies changes in color of urine, dysuria, and increased frequency. He also denied palpations, chest pain, shortness of breath, dizziness, abdominal pain, and difficulty with speech.   Patient previously admitted from 9/30 - 10/1 for an acute R ACA stroke, which presented with intermittent, self resolving LLE weakness  and possible stuttering of his speech. The patient was admitted to the internal medicine teaching service for management with neurology consulting during admission. At the time of discharge the patient had been symptom free for 24 hours. He was ambulating independently, working well with physical therapy, and had no significant neurologic defects on exam. The patient was seen by cardiology and was to be scheduled for outpatient TTE and cardiac loop monitor to work up possible cardioembolic disease. Patient was discharged with dual antiplatelet therapy. Since discharge the patient spent one day at home relaxing and recuperating after his hospital admission. The patient states he felt great and denied any difficulties with lower extremity weakness prior to this morning.   The patient arrived to the ED hypotensive and has responded to fluid rescuscitation. A code stroke was called and neurology was consulted. His head CT showed changes consistent with known, previous R ACA infarct without hemorrhagic transformation or new areas of ICA/MCA infarction. His EKG showed normal sinus rhythm. His CMP was significant for K of 3.4 and his Hgb was stable from previous admission around 12. His iSTAT troponin was negative. Per chart review, the patient's BP when EMS arrived was in the 60s/40s and increased to 88/60 after receiving IV fluids. At that time he also had difficulty lifting his left leg and arm. The patient's symptoms of L sided weakness  had resolved upon arrival to the ED.  Meds:  Current Meds  Medication Sig  . aspirin EC 325 MG EC tablet Take 1 tablet (325 mg total) by mouth daily.  . atorvastatiMarland Kitchenn (LIPITOR) 40 MG tablet Take 1 tablet (40 mg total) by mouth daily at 6 PM.  . clopidogrel (PLAVIX) 75 MG tablet Take 1 tablet (75 mg total) by mouth daily.  . hydrochlorothiazide (HYDRODIURIL) 25 MG tablet Take 25 mg by mouth daily.  Marland Kitchen losartan (COZAAR) 100 MG tablet Take 100 mg by mouth daily.  . potassium  chloride SA (K-DUR,KLOR-CON) 20 MEQ tablet Take 1 tablet (20 mEq total) by mouth 3 (three) times daily.   Allergies: Allergies as of 07/08/2017  . (No Known Allergies)   Past Medical History: Past Medical History:  Diagnosis Date  . Hypertension    Family History:  Family History  Problem Relation Age of Onset  . 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 Patient denies tobacco use, alcohol use, and illicit drug use.  Review of Systems: A complete ROS was negative except as per HPI.   Physical Exam: Blood pressure 110/70, pulse 67, temperature 97.7 F (36.5 C), resp. rate 18, SpO2 99 %.  Physical Exam  Constitutional: He is oriented to person, place, and time. He appears well-developed and well-nourished. No distress.  HENT:  Mouth/Throat: No oropharyngeal exudate.  Oropharynx clear and dry  Eyes: Pupils are equal, round, and reactive to light. Conjunctivae and EOM are normal.  Cardiovascular: Normal rate, regular rhythm and intact distal pulses.  Exam reveals no friction rub.   No murmur heard. Pulmonary/Chest: Effort normal. No respiratory distress. He has no wheezes.  Abdominal: Soft. He exhibits no distension. There is no tenderness. There is no guarding.  Musculoskeletal: He exhibits no edema (of bilateral lower extremities) or tenderness (of bilateral lower extremities).  Lymphadenopathy:    He has no cervical adenopathy.  Neurological: He is alert and oriented to person, place, and time.  Face strength and sensation intact bilaterally. Tongue midline. 5/5 bicep, tricep, and grip strength bilaterally. 5/5 hip flexion, dorsiflexion, and plantarflexion bilaterally. Gross sensation to light touch of upper and lower extremities intact bilaterally. RAM intact bilaterally. Heel to shin testing within normal limits bilaterally.  Skin: Skin is warm and dry. No erythema. No pallor.  Psychiatric: Judgment and thought  content normal.   EKG: personally reviewed my interpretation is sinus rhythm without evidence of ischemia  CXR: personally reviewed my interpretation is no signs of pulmonary edema or focal consolidation to suggest pneumonia.  Assessment & Plan by Problem: Active Problems:   Syncope  Mr Johannes Everage is a 61 yo with a PMH of hypertension and recent R ACA stroke who presented after a syncopal episode and LLE weakness. A code stroke was called in the ED and his head CT showed evidence of known ACA infarction without new ICA or MCA infarct. He was admitted to the internal medicine teaching service with neurology consulting for management. The specific problems addressed during his admission were as follows:  LLE weakness and recent history of R ACA stroke (07/05/17): Patient previously presented with similar symptoms, which occurred x3 times over the course of a few hours prior to identifying his R ACA stroke. Neurology consult in ED suggested that his symptoms of LLE weakness may have been a result of new stroke, recurrence of symptoms from previous stroke, or a result of symptomatic hypotension.  Will perform Brain MRI to assess for new stroke and/or expansion of old stroke territory. Will also obtain TEE and TTE to assess for cardioembolic disease. Will also continue dual antiplatelet therapy while inpatient and continue neuro checks to monitor for symptoms.  -Neurology consulted, appreciate recommendations -Neuro checks q 2 hours -Brain MRI and TTE ordered -TEE scheduled for Friday 07/10/17 -Continue home aspirin 325 mg and Plavix 75 mg during admission -PT/OT evaluation and treat  Syncope: It is unsure if the patient has underlying arrhythmia which would result in syncope, as previous EKGs have shown sinus rhythm with the exception of one EKG that showed mobitz type 1 heart block (on admission on 07/05/17, patient asymptomatic). The patient denies symptoms of palpitations prior to fall and  history of chest pain which would be consistent with arrhythmia. It seems that his syncope was a result of hypotension, as the patient states he stood in one place for 3 hours at work and had hypotension on arrival to the ED. His BP has improved with fluid administration and will continue to give fluids while inpatient. His overall presentation is concerning and may warrant cardiology consult in AM once other studies have resulted.  -Telemetry while inpatient -Orthostatic vital signs -Repeat EKG in AM -NS @ rate of 75 ml/hr (s/p fluid bolus with EMS and fluids in ED)  Hx of Hypertension: Patient hypotensive upon EMS arrival. He has responded well to fluid resuscitation in ED, suggesting patient may have been overall volume down at the onset of symptoms. Nevertheless will hold home anti-hypertensive medications 2/2 hypotension. -Home regimen includes HCTZ 25 mg and losartan 100 mg daily  Fluids: NS @ rate of 75 ml/hr, replace electrolytes as needed Diet: Pending swallow study, regular diet after stroke evaluation complete DVT prophylaxis: Lovenox  Dispo: Admit patient to Inpatient with expected length of stay greater than 2 midnights.  SignedRozann Lesches, MD 07/08/2017, 1:34 PM  Pager: [phone]

## 2017-07-08 NOTE — Progress Notes (Signed)
Pt arrived to the unit from ED via stretcher. Pt is pleasant in no apparent distress. Pt states he no longer has left lower leg weakness and dizziness symptoms. VSS. Telemetry placed. NIH stroke scale noted at 0. MD for night coverage paged for diet order since pt already passed swallow screen in ED.

## 2017-07-08 NOTE — ED Provider Notes (Signed)
[facility]-EMERGENCY DEPT Provider Note   CSN: [id number] Arrival date & time: 07/08/17  7829   An emergency department physician performed an initial assessment on this suspected stroke patient at [facility]).  History   Chief Complaint No chief complaint on file.   HPI JAKYLAN RON is a 61 y.o. male.  61 year old male presented with acute onset of left upper extremity weakness as well as syncopal event prior to arrival. The patient did strike his head. Denies any neck pain. Patient been discharged from the hospital 2 days ago after being diagnosed with a CVA. Back to work today and said that he was standing up from his machine and then he awoke on the ground. Denied any chest pain or chest pressure prior to the event. States that he was not short of breath. No reported seizure activity. EMS called and blood sugar reported to be about 100 and patient transported here.      Past Medical History:  Diagnosis Date  . Hypertension     Patient Active Problem List   Diagnosis Date Noted  . Mobitz type 1 second degree atrioventricular block 07/05/2017  . CVA (cerebral vascular accident) (HCC) 07/05/2017  . Nasal sinus congestion 02/25/2016  . Elevated liver enzymes 08/28/2015  . Dyslipidemia 03/22/2015  . Encounter for general adult medical examination without abnormal findings 03/22/2015  . BP (high blood pressure) 03/22/2015  . Cardiac murmur 03/22/2015  . Adiposity 03/22/2015  . Diuretic-induced hypokalemia 03/22/2015    No past surgical history on file.     Home Medications    Prior to Admission medications   Medication Sig Start Date End Date Taking? Authorizing Provider  aspirin EC 325 MG EC tablet Take 1 tablet (325 mg total) by mouth daily. 07/07/17   Rozann Lesches, MD  atorvastatin (LIPITOR) 40 MG tablet Take 1 tablet (40 mg total) by mouth daily at 6 PM. 07/06/17   Nedrud, Jeanella Flattery, MD  clopidogrel (PLAVIX) 75 MG tablet Take 1 tablet (75 mg total) by mouth  daily. 07/07/17 07/28/17  Rozann Lesches, MD  fluticasone (FLONASE) 50 MCG/ACT nasal spray Place 2 sprays into both nostrils daily. 02/25/16   Ellyn Hack, MD  losartan-hydrochlorothiazide (HYZAAR) 100-25 MG tablet Take 1 tablet by mouth daily. 09/16/16   Ellyn Hack, MD  potassium chloride SA (K-DUR,KLOR-CON) 20 MEQ tablet Take 1 tablet (20 mEq total) by mouth 3 (three) times daily. 07/06/17   Rozann Lesches, MD    Family History Family History  Problem Relation Age of Onset  . Heart attack Father     Social History Social History  Substance Use Topics  . Smoking status: Never Smoker  . Smokeless tobacco: Never Used  . Alcohol use No     Allergies   Patient has no known allergies.   Review of Systems Review of Systems  All other systems reviewed and are negative.    Physical Exam Updated Vital Signs BP 103/69 (BP Location: Right Arm)   Pulse (!) 51   Temp 97.7 F (36.5 C) (Oral)   Resp 10   SpO2 100%   Physical Exam  Constitutional: He is oriented to person, place, and time. He appears well-developed and well-nourished.  Non-toxic appearance. No distress.  HENT:  Head: Head is with contusion.    Eyes: Pupils are equal, round, and reactive to light. Conjunctivae, EOM and lids are normal.  Neck: Normal range of motion. Neck supple. No tracheal deviation present. No thyroid mass present.  Cardiovascular: Normal  rate, regular rhythm and normal heart sounds.  Exam reveals no gallop.   No murmur heard. Pulmonary/Chest: Effort normal and breath sounds normal. No stridor. No respiratory distress. He has no decreased breath sounds. He has no wheezes. He has no rhonchi. He has no rales.  Abdominal: Soft. Normal appearance and bowel sounds are normal. He exhibits no distension. There is no tenderness. There is no rebound and no CVA tenderness.  Musculoskeletal: Normal range of motion. He exhibits no edema or tenderness.  Neurological: He is alert and oriented  to person, place, and time. He has normal strength. No cranial nerve deficit or sensory deficit. Coordination normal. GCS eye subscore is 4. GCS verbal subscore is 5. GCS motor subscore is 6.  Skin: Skin is warm and dry. No abrasion and no rash noted.  Psychiatric: He has a normal mood and affect. His speech is normal and behavior is normal.  Nursing note and vitals reviewed.    ED Treatments / Results  Labs (all labs ordered are listed, but only abnormal results are displayed) Labs Reviewed  I-STAT CHEM 8, ED - Abnormal; Notable for the following:       Result Value   Potassium 3.1 (*)    Glucose, Bld 101 (*)    Calcium, Ion 1.11 (*)    All other components within normal limits  I-STAT TROPONIN, ED  CBG MONITORING, ED    EKG  EKG Interpretation None       Radiology Ct Head Wo Contrast  Result Date: 07/08/2017 CLINICAL DATA:  LEFT-sided weakness. Possible cardiac event, hypotension, recent stroke. EXAM: CT HEAD WITHOUT CONTRAST TECHNIQUE: Contiguous axial images were obtained from the base of the skull through the vertex without intravenous contrast. COMPARISON:  CT head, CTA head, and MR head 07/05/2017. FINDINGS: Brain: Evolving cytotoxic edema, RIGHT anterior medial frontal lobe, correlating with the area of cortical infarction noted previous. Otherwise, no new areas of suspected cerebral infarction, hemorrhage, hydrocephalus, or extra-axial fluid. 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 Vascular: Calcification of the cavernous internal carotid arteries consistent with cerebrovascular atherosclerotic disease. No signs of intracranial large vessel occlusion. Skull: Normal. Negative for fracture or focal lesion. Sinuses/Orbits: No acute finding. Other: None. ASPECTS [facility] Stroke Program Early CT Score) - Ganglionic level infarction (caudate, lentiform nuclei, internal  capsule, insula, M1-M3 cortex): 7 - Supraganglionic infarction (M4-M6 cortex): 3 Total score (0-10 with 10 being normal): 10 IMPRESSION: 1. Expected evolutionary change of the previously identified ACA territory RIGHT frontal infarct. No hemorrhagic transformation or new areas of ICA/MCA infarction. 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 2. ASPECTS is 10. These results were called by telephone at the time of interpretation on 07/08/2017 at 10:20 am to Dr. Wilford Corner, who verbally acknowledged these results. Electronically Signed   By: Elsie Stain M.D.   On: 07/08/2017 10:36    Procedures Procedures (including critical care time)  Medications Ordered in ED Medications - No data to display   Initial Impression / Assessment and Plan / ED Course  I have reviewed the triage vital signs and the nursing notes.  Pertinent labs & imaging results that were available during my care of the patient were reviewed by me and considered in my medical decision making (see chart for details).     Patient currently does not have any neurological findings. Hasn't seen by neurology who recommends admission for further evaluation. Also will require a  cardiac evaluation well. Discussed with internal medicine teaching service will come and admit the patient  Final Clinical Impressions(s) / ED Diagnoses   Final diagnoses:  None    New Prescriptions New Prescriptions   No medications on file     Lorre Nick, MD 07/08/17 1223

## 2017-07-08 NOTE — ED Triage Notes (Signed)
Per EMS- around 0530 woke up feeling fine. pt noted left sided weakness with right sided headache around 0900. PT had syncopal episode at work, hit his right upper had, knot noted to head. Pt BPs hyotensive in 60s with EMS with HR in the 60s. (per EMS) EMS activated a Code Stroke. 18G PIV to LFA 18G PIV to L Hand, 500 bolus. Pt discharged this weekend for a Stroke, right sided, with left sided weakness. Alert and oriented X4, no neuro deficits at present.

## 2017-07-08 NOTE — Code Documentation (Signed)
61yo male arriving to [facility] via [facility] at 82.  Patient from work where he had a syncopal episode and developed left sided weakness.  LKW 0530.  EMS called and activated a code stroke.  BP 62/40 per EMS.  Of note, patient admitted on 07/05/2017 s/p small acute right ACA infarct.  Stroke team at the bedside on patient arrival.  RN drew labs and patient cleared for CT by Dr. Freida Busman.  Patient to CT with team.  CT completed.  NIHSS 0, see documentation for details and code stroke times.  No acute stroke treatment d/t outside window and contraindicated for tPA d/t recent infarct.  Patient to be admitted.  Bedside handoff with ED RN Tobi Bastos.

## 2017-07-08 NOTE — Consult Note (Signed)
Neurology Consultation  Reason for Consult: Acute code stroke Referring Physician: Dr. Freida Busman  CC: Left-sided weakness, syncope  History is obtained from: Patient, wife, chart  HPI: Tyler Gomez is a 61 y.o. male was a past medical history of hypertension and a recent right ACA stroke, for which she received workup at this hospital and was discharged 2 days ago, presents again via EMS for evaluation of left-sided weakness which followed an episode of syncope at work this morning. The patient was in his usual state of health, woke up at 5:30 AM this morning, went to work. He is a Chartered certified accountant. He was at work and had a episode of "passing out". When he came to, he was unable to lift his left arm or leg. Unknown if his speech was slurred or if he had any facial weakness. When EMS arrived, they noted left arm and leg drift. Upon arrival into the emergency room, his symptoms had completely resolved. Of note, when EMS were evaluating him and initially, his systolic blood pressure was in the 60s-62/40, which increased to 88/64 after receiving some IV fluids. While reviewing his chart, upon last admission, he had bradycardia with heart rate in the 30s. He received a stroke workup during his last admission as documented in epic. He was recommended to be discharged on dual antiplatelet therapy for 3 weeks followed by aspirin only. He was advised to follow-up with outpatient neurology. He was advised to get an outpatient TEE and possible loop recorder. None of the follow-ups had occurred yet since he was only discharged 2 days ago. He denies any preceding fevers or chills or having felt sick. Other review of systems is negative.   LKW: 5:30 AM on 07/08/2017 tpa given?: no, NIH 0, stroke less than a week ago Premorbid modified Rankin scale (mRS): 0    ROS: Review of Systems  Constitutional: Positive for fever. Negative for chills and weight loss.  HENT: Positive for hearing loss. Negative for ear  pain and tinnitus.   Eyes: Negative for blurred vision, double vision and photophobia.  Respiratory: Negative for cough and hemoptysis.   Cardiovascular: Negative for chest pain and palpitations.  Gastrointestinal: Negative for heartburn and nausea.  Genitourinary: Negative for dysuria and frequency.  Musculoskeletal: Negative for myalgias and neck pain.  Skin: Negative for itching and rash.  Neurological: Positive for focal weakness and loss of consciousness. Negative for dizziness, tingling, tremors, sensory change, speech change, seizures and headaches.  Endo/Heme/Allergies: Negative for environmental allergies. Does not bruise/bleed easily.  Psychiatric/Behavioral: Negative for depression and suicidal ideas.     Past Medical History:  Diagnosis Date  . Hypertension     Family History  Problem Relation Age of Onset  . Heart attack Father     Social History:   reports that he has never smoked. He has never used smokeless tobacco. He reports that he does not drink alcohol or use drugs.  Medications No current facility-administered medications for this encounter.   Current Outpatient Prescriptions:  .  aspirin EC 325 MG EC tablet, Take 1 tablet (325 mg total) by mouth daily., Disp: 30 tablet, Rfl: 0 .  atorvastatin (LIPITOR) 40 MG tablet, Take 1 tablet (40 mg total) by mouth daily at 6 PM., Disp: 30 tablet, Rfl: 0 .  clopidogrel (PLAVIX) 75 MG tablet, Take 1 tablet (75 mg total) by mouth daily., Disp: 21 tablet, Rfl: 0 .  fluticasone (FLONASE) 50 MCG/ACT nasal spray, Place 2 sprays into both nostrils daily., Disp: 16 g,  Rfl: 1 .  losartan-hydrochlorothiazide (HYZAAR) 100-25 MG tablet, Take 1 tablet by mouth daily., Disp: 90 tablet, Rfl: 1 .  potassium chloride SA (K-DUR,KLOR-CON) 20 MEQ tablet, Take 1 tablet (20 mEq total) by mouth 3 (three) times daily., Disp: 90 tablet, Rfl: 0  Exam: Current vital signs: BP 114/74   Pulse (!) 55   Temp 97.7 F (36.5 C) (Oral)   Resp 13    SpO2 100%  Vital signs in last 24 hours:   GENERAL: Awake, alert in NAD HEENT: - Normocephalic and atraumatic, dry mm, no LN++, no Thyromegally LUNGS - Clear to auscultation bilaterally with no wheezes CV - S1S2 RRR, no m/r/g, equal pulses bilaterally. ABDOMEN - Soft, nontender, nondistended with normoactive BS Ext: warm, well perfused, intact peripheral pulses, no edema  NEURO:  Mental Status: AA&Ox3  Language: speech is clear  Naming, repetition, fluency, and comprehension intact. Cranial Nerves: PERRL 79mm/brisk. EOMI, visual fields full, no facial asymmetry, facial sensation intact, hearing intact, tongue/uvula/soft palate midline, normal sternocleidomastoid and trapezius muscle strength. No evidence of tongue atrophy or fibrillations Motor: 5/5 all over with no drift. Tone: is normal and bulk is normal Sensation- Intact to light touch bilaterally Coordination: FTN intact bilaterally, no ataxia in BLE. Gait- deferred  NIHSS - 0   Labs I have reviewed labs in epic and the results pertinent to this consultation are:  CBC    Component Value Date/Time   WBC 4.5 07/05/2017 1010   RBC 4.62 07/05/2017 1010   HGB 14.3 07/05/2017 1031   HGB 14.1 11/30/2013 1125   HCT 42.0 07/05/2017 1031   HCT 42.9 11/30/2013 1125   PLT 239 07/05/2017 1010   PLT 216 11/30/2013 1125   MCV 84.6 07/05/2017 1010   MCV 83 11/30/2013 1125   MCH 27.7 07/05/2017 1010   MCHC 32.7 07/05/2017 1010   RDW 13.5 07/05/2017 1010   RDW 14.0 11/30/2013 1125   LYMPHSABS 0.8 07/05/2017 1010   MONOABS 0.4 07/05/2017 1010   EOSABS 0.1 07/05/2017 1010   BASOSABS 0.0 07/05/2017 1010    CMP     Component Value Date/Time   NA 139 07/05/2017 1031   NA 143 08/28/2015 1158   NA 133 (L) 11/30/2013 1125   K 3.2 (L) 07/05/2017 1031   K 2.9 (L) 11/30/2013 1125   CL 105 07/05/2017 1031   CL 98 11/30/2013 1125   CO2 26 07/05/2017 1010   CO2 23 11/30/2013 1125   GLUCOSE 145 (H) 07/05/2017 1031   GLUCOSE 109 (H)  11/30/2013 1125   BUN 14 07/05/2017 1031   BUN 8 08/28/2015 1158   BUN 16 11/30/2013 1125   CREATININE 1.00 07/05/2017 1031   CREATININE 2.02 (H) 11/30/2013 1125   CALCIUM 9.3 07/05/2017 1010   CALCIUM 8.7 11/30/2013 1125   PROT 8.2 (H) 07/05/2017 1010   PROT 8.8 (H) 08/28/2015 1158   PROT 8.7 (H) 11/30/2013 1125   ALBUMIN 4.1 07/05/2017 1010   ALBUMIN 4.7 08/28/2015 1158   ALBUMIN 4.1 11/30/2013 1125   AST 45 (H) 07/05/2017 1010   AST 46 (H) 11/30/2013 1125   ALT 34 07/05/2017 1010   ALT 56 11/30/2013 1125   ALKPHOS 43 07/05/2017 1010   ALKPHOS 52 11/30/2013 1125   BILITOT 0.8 07/05/2017 1010   BILITOT 0.5 08/28/2015 1158   BILITOT 0.5 11/30/2013 1125   GFRNONAA >60 07/05/2017 1010   GFRNONAA 35 (L) 11/30/2013 1125   GFRAA >60 07/05/2017 1010   GFRAA 41 (L) 11/30/2013 1125  Lipid Panel     Component Value Date/Time   CHOL 135 07/06/2017 0502   CHOL 185 08/28/2015 1158   TRIG 79 07/06/2017 0502   HDL 28 (L) 07/06/2017 0502   HDL 34 (L) 08/28/2015 1158   CHOLHDL 4.8 07/06/2017 0502   VLDL 16 07/06/2017 0502   LDLCALC 91 07/06/2017 0502   LDLCALC 120 (H) 08/28/2015 1158     Imaging I have reviewed the images obtained:  CT-scan of the brainDone today does not reveal any acute bleed. It shows an established hypodensity in the right ACA territory, which is the recent right ACA stroke that he had that was seen on the MRI done a few days ago during his last hospitalization. CT angiogram from last admission suggestive of diffuse intracranial atherosclerosis  Assessment:  61 year old man with a past medical history of hypertension and a recent right ACA stroke a few days ago with no residual weakness, presents to the emergency room for evaluation of a syncopal episode followed by left-sided weakness that has since resolved. His NIH stroke scale is 0. He was discharged with the advice to follow-up with outpatient neurology, get an outpatient EEG, and be on dual  antiplatelet therapy for 3 weeks. Today's episode could reflect either extension of his right ACA stroke, or a new stroke, or could represent transient symptoms from the hypotension. In any case, he would benefit from further workup. Not a candidate for TPA due to recent stroke. Next line not a candidate for endovascular therapy because of no evidence of large vessel occlusion on exam  Impression: New stroke versus recrudescence of symptoms from the recent right ACA stroke versus symptomatic hypotension  Recommendations: He recently received most of the stroke workup. I have not been able to review record of his echocardiogram. If it was not done, please repeat a 2-D echocardiogram. He might require a TEE and evaluation for possible loop recorder if the TTE is unremarkable. I would recommend a cardiology consultation because his last presentation when he presented with a stroke was accompanied with bradycardia and today he has had a syncopal episode with hypotension. He should continue on his dual antiplatelets and statin at this time. Maintain on telemetry. Allow for permissive hypertension for 24-48 hours. Repeat MRI to see if the stroke has extended. He'll be followed by the stroke team and further recommendations will be based on imaging and clinical course.   -- Milon Dikes, MD [facility] [phone]  If 7pm to 7am, please call on call as listed on AMION.

## 2017-07-09 ENCOUNTER — Inpatient Hospital Stay (HOSPITAL_COMMUNITY): Payer: Managed Care, Other (non HMO)

## 2017-07-09 ENCOUNTER — Encounter (HOSPITAL_COMMUNITY): Payer: Self-pay

## 2017-07-09 DIAGNOSIS — I440 Atrioventricular block, first degree: Secondary | ICD-10-CM

## 2017-07-09 DIAGNOSIS — R55 Syncope and collapse: Secondary | ICD-10-CM

## 2017-07-09 DIAGNOSIS — W19XXXA Unspecified fall, initial encounter: Secondary | ICD-10-CM

## 2017-07-09 DIAGNOSIS — S066X9A Traumatic subarachnoid hemorrhage with loss of consciousness of unspecified duration, initial encounter: Secondary | ICD-10-CM

## 2017-07-09 LAB — CBC
HCT: 35.2 % — ABNORMAL LOW (ref 39.0–52.0)
Hemoglobin: 11.9 g/dL — ABNORMAL LOW (ref 13.0–17.0)
MCH: 28.7 pg (ref 26.0–34.0)
MCHC: 33.8 g/dL (ref 30.0–36.0)
MCV: 85 fL (ref 78.0–100.0)
Platelets: 208 10*3/uL (ref 150–400)
RBC: 4.14 MIL/uL — ABNORMAL LOW (ref 4.22–5.81)
RDW: 13.2 % (ref 11.5–15.5)
WBC: 5.7 10*3/uL (ref 4.0–10.5)

## 2017-07-09 LAB — BASIC METABOLIC PANEL
Anion gap: 7 (ref 5–15)
BUN: 6 mg/dL (ref 6–20)
CO2: 22 mmol/L (ref 22–32)
Calcium: 8.8 mg/dL — ABNORMAL LOW (ref 8.9–10.3)
Chloride: 109 mmol/L (ref 101–111)
Creatinine, Ser: 0.82 mg/dL (ref 0.61–1.24)
GFR calc Af Amer: >60 mL/min (ref >60)
GFR calc non Af Amer: >60 mL/min (ref >60)
Glucose, Bld: 99 mg/dL (ref 65–99)
Potassium: 3.5 mmol/L (ref 3.5–5.1)
Sodium: 138 mmol/L (ref 135–145)

## 2017-07-09 MED ORDER — ASPIRIN EC 81 MG PO TBEC: 81.0000 mg | DELAYED_RELEASE_TABLET | Freq: Every day | ORAL | Status: DC

## 2017-07-09 MED ORDER — POTASSIUM CHLORIDE CRYS ER 20 MEQ PO TBCR
40.0000 meq | EXTENDED_RELEASE_TABLET | Freq: Once | ORAL | Status: AC
  Administered 2017-07-09: 40 meq via ORAL
  Filled 2017-07-09: qty 2

## 2017-07-09 NOTE — Progress Notes (Signed)
SLP Cancellation Note  Patient Details Name: Tyler Gomez MRN: [id number] DOB: 1956/08/04   Cancelled treatment:       Reason Eval/Treat Not Completed: SLP screened, no needs identified, will sign off   Blenda Mounts Laurice 07/09/2017, 4:10 PM

## 2017-07-09 NOTE — Progress Notes (Signed)
STROKE TEAM PROGRESS NOTE   HISTORY OF PRESENT ILLNESS (per record) Tyler Gomez is a 61 y.o. male was a past medical history of hypertension and a recent right ACA stroke, for which she received workup at this hospital and was discharged 2 days ago, presents again via EMS for evaluation of left-sided weakness which followed an episode of syncope at work this morning. The patient was in his usual state of health, woke up at 5:30 AM this morning, went to work. He is a Chartered certified accountant. He was at work and had a episode of "passing out". When he came to, he was unable to lift his left arm or leg. Unknown if his speech was slurred or if he had any facial weakness. When EMS arrived, they noted left arm and leg drift. Upon arrival into the emergency room, his symptoms had completely resolved. Of note, when EMS were evaluating him and initially, his systolic blood pressure was in the 60s-62/40, which increased to 88/64 after receiving some IV fluids. While reviewing his chart, upon last admission, he had bradycardia with heart rate in the 30s. He received a stroke workup during his last admission as documented in epic. He was recommended to be discharged on dual antiplatelet therapy for 3 weeks followed by aspirin only. He was advised to follow-up with outpatient neurology. He was advised to get an outpatient TEE and possible loop recorder. None of the follow-ups had occurred yet since he was only discharged 2 days ago. He denies any preceding fevers or chills or having felt sick. Other review of systems is negative.   LKW: 5:30 AM on 07/08/2017 tpa given?: no, NIH 0, stroke less than a week ago Premorbid modified Rankin scale (mRS): 0  SUBJECTIVE (INTERVAL HISTORY) His wife is at the bedside.   Patient states he passed out at work and had a syncopal event and EMS found his blood pressure to be in the 60-80 systolic range which improved after IV fluids. He suffered a contusion on the forehead and a  tiny subarachnoid hemorrhage in the occipital region which is likely posttraumatic. He states his back to his neurological baseline   OBJECTIVE Temp:  [97.7 F (36.5 C)-99.6 F (37.6 C)] 98.5 F (36.9 C) (10/04 0534) Pulse Rate:  [51-73] 61 (10/04 0534) Cardiac Rhythm: Heart block (10/03 1901) Resp:  [10-27] 18 (10/04 0534) BP: (103-138)/(69-94) 129/79 (10/04 0534) SpO2:  [98 %-100 %] 99 % (10/04 0534) Weight:  [199 lb 11.2 oz (90.6 kg)] 199 lb 11.2 oz (90.6 kg) (10/03 1900)  CBC:  Recent Labs Lab 07/05/17 1010  07/08/17 1034 07/08/17 1103  WBC 4.5  --   --  5.7  NEUTROABS 3.1  --   --  4.4  HGB 12.8*  < > 13.9 12.1*  HCT 39.1  < > 41.0 37.5*  MCV 84.6  --   --  85.4  PLT 239  --   --  216  < > = values in this interval not displayed.  Basic Metabolic Panel:  Recent Labs Lab 07/05/17 1010  07/08/17 1034 07/08/17 1103  NA 136  < > 139 136  K 3.2*  < > 3.1* 3.4*  CL 102  < > 101 104  CO2 26  --   --  23  GLUCOSE 146*  < > 101* 101*  BUN 12  < > 11 10  CREATININE 1.10  < > 0.90 1.08  CALCIUM 9.3  --   --  9.0  < > =  values in this interval not displayed.  Lipid Panel:    Component Value Date/Time   CHOL 135 07/06/2017 0502   CHOL 185 08/28/2015 1158   TRIG 79 07/06/2017 0502   HDL 28 (L) 07/06/2017 0502   HDL 34 (L) 08/28/2015 1158   CHOLHDL 4.8 07/06/2017 0502   VLDL 16 07/06/2017 0502   LDLCALC 91 07/06/2017 0502   LDLCALC 120 (H) 08/28/2015 1158   HgbA1c:  Lab Results  Component Value Date   HGBA1C 5.9 (H) 07/06/2017   Urine Drug Screen: No results found for: LABOPIA, COCAINSCRNUR, LABBENZ, AMPHETMU, THCU, LABBARB  Alcohol Level No results found for: [facility]  IMAGING  Dg Chest 2 View 07/08/2017 IMPRESSION: Mild cardiomegaly.  No acute cardiopulmonary abnormality.  Ct Head Wo Contrast 07/08/2017 IMPRESSION: 1. Expected evolutionary change of the previously identified ACA territory RIGHT frontal infarct. No hemorrhagic transformation or new areas of  ICA/MCA infarction. 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 2. ASPECTS is 10.  MRI Brain Wo contrast 07/09/2017 Slight enlargement of the region of infarction in the right frontal lobe, now measuring about 2 x 2.5 cm as opposed to 1 x 1.5 cm on the previous study. No evidence of mass effect or parenchymal hemorrhage in this location.  PHYSICAL EXAM Pleasant middle-aged African-American male currently not in distress.Right frontal small scalp hematoma. . Afebrile. Head is nontraumatic. Neck is supple without bruit.    Cardiac exam no murmur or gallop. Lungs are clear to auscultation. Distal pulses are well felt. Neurological Exam ;  Awake  Alert oriented x 3. Normal speech and language.eye movements full without nystagmus.fundi were not visualized. Vision acuity and fields appear normal. Hearing is normal. Palatal movements are normal. Face symmetric. Tongue midline. Normal strength, tone, reflexes and coordination. Normal sensation. Gait deferred.  ASSESSMENT/PLAN [name] is a 61 y.o. male with history of hypertension, very recent ACA stroke presenting with left sided weakness and syncopal episode at work. He did not receive IV t-PA due to very recent stroke.   Subacute stroke symptoms worsened after syncopal episode:  right frontal lobe infarct previously identified with slight enlargement from 1 x 1.5cm to 2 x 2.5cm. His new subaracnoid hemorrhage is postraumatic contrecoup from fall with associated scalp hematoma on aspirin .  Resultant  Returned to baseline left sided weakness  CT head Expected evolutionary changes of previous ACA infarct  MRI head Small expansion of R ACA infarct, possible small  Enoxaparin for VTE prophylaxis  Diet regular Room service appropriate? Yes; Fluid consistency: Thin  aspirin 325 mg daily, plavix  daily prior to admission, recommend aspirin  plavix   Patient counseled to be compliant with his  antithrombotic medications  Ongoing aggressive stroke risk factor management  No need for repeat extensive CVA evaluation at this time  Hypertension Low blood pressure on arrival, possibly contributing to focal hypoperfusion in setting of recent emboic stroke  Hyperlipidemia  Recently checked LDL 91 > goal 70  Continue atorvastatin   Diabetes  Recently checked Hgb A1c 5.9% < goal 7.0%  Other Stroke Risk Factors  Advanced age  Overweight, Body mass index is 27.08 kg/m., recommend weight loss, diet and exercise as appropriate   Hx stroke/TIA  Other Active Problems  Hypotension, inappropriately low heart rate. Consider cardiology or EP evaluation vs medications.  Continue aspirin daily at  with plavix DAPT  Hospital day # 1 I have personally examined this patient, reviewed notes, independently viewed imaging studies, participated in medical decision making  and plan of care.ROS completed by me personally and pertinent positives fully documented  I have made any additions or clarifications directly to the above note.  Patient presented with transient worsening of her recent stroke symptoms in the setting of a syncopal episode and mild concussion. He has small posttraumatic subarachnoid hemorrhage. Recommend hold aspirin for a few days and then restart 81 mg daily. Do not resume Plavix. Long discussion with patient and wife and answered questions. Greater than 50% time during this 35 minute visit was spent on counseling and coordination of care about his posttraumatic subarachnoid hemorrhage, recent stroke and answered questions. Discussed with family practice medical resident   Delia Heady, MD Medical Director [facility] Pager: [phone] 07/09/2017 3:26 PM    To contact Stroke Continuity provider, please refer to [email or web address]. After hours, contact General Neurology

## 2017-07-09 NOTE — Plan of Care (Signed)
Problem: Education: Goal: Knowledge of disease or condition will improve Outcome: Progressing Pt verbalizes understanding of signs and symptoms of stroke.

## 2017-07-09 NOTE — Care Management Note (Signed)
Case Management Note  Patient Details  Name: Tyler Gomez MRN: [id number] Date of Birth: 1956-01-06  Subjective/Objective:   Pt in with syncope. He is from home with his spouse. He was recently d/ced with CVA. He was set up with [facility] Main rehab for outpatient therapy at last admission.                  Action/Plan: No f/u per PT with no DME needs. Awaiting OT eval. CM following for d/c needs, physician orders.   Expected Discharge Date:                  Expected Discharge Plan:  Home/Self Care  In-House Referral:     Discharge planning Services     Post Acute Care Choice:    Choice offered to:     DME Arranged:    DME Agency:     HH Arranged:    HH Agency:     Status of Service:  In process, will continue to follow  If discussed at Long Length of Stay Meetings, dates discussed:    Additional Comments:  Kermit Balo, RN 07/09/2017, 3:54 PM

## 2017-07-09 NOTE — Progress Notes (Addendum)
Internal Medicine Attending Attestation:   I have seen and evaluated this patient and I have discussed the plan of care with the house staff. Please see their note for complete details. I concur with their findings with the following additions/corrections:   Seen together on rounds this morning. I'm familiar with him from his recent admission for a right ACA stroke. Unfortunately, he seems to have had revisitation of his stroke symptoms during an episode of hypotension that occurred at work. The etiology of this episode is not entirely clear. There is associated with feeling cold and clammy and weak, and followed a long period of time of standing at work. This may been vasovagal in origin, or possibly related to dehydration and overexertion. There is also possible it is related to bradycardia, as suggested by neurology, although I have not seen any episodes of severe, symptomatic bradycardia on telemetry during either of his admissions. He does appear to have occasional first-degree AV block and a few rare episodes of Mobitz type I second-degree block, but I have not witnessed any other AV block on review of his telemetry. These are consistent with high vagal tone, which may have contributed to his episode that brought him back to the hospital. We will continue to watch him closely on telemetry and if he has any more concerning episodes, we will certainly consult EP.  Unfortunately, his stroke appears to be slightly larger than on prior imaging, although it is not clear whether this is expected evolution of his stroke or extension of the stroke due to the hypotensive episode. He also had a small subarachnoid hemorrhage associated with the fall that he has sustained during the event. On exam today, he appears to be back to baseline with the exception of a headache and a sizable lump on his forehead.  After discussion with neurology, we will hold his total antiplatelet therapy and continue him just on aspirin  for now to avoid exacerbation of his subarachnoid hemorrhage. He is scheduled for a transesophageal echocardiogram tomorrow with cardiology.  Anne Shutter, MD 07/09/2017, 4:36 PM     Subjective:  Patient was seen laying comfortably in bed this AM in no acute distress. He just returned to from MRI at time of interview. He endorses mild headache from hitting his head yesterday but it does not bother him too much. He has not had any trouble with his LLE and denies chest pain, dizziness, SOB, abdominal pain, and headaches.  Objective:  Vital signs in last 24 hours: Vitals:   07/08/17 1900 07/08/17 2117 07/09/17 0200 07/09/17 0534  BP: 122/77 115/73 120/81 129/79  Pulse: 61 61 61 61  Resp:  Temp: 98.6 F (37 C) 99.6 F (37.6 C) 99.4 F (37.4 C) 98.5 F (36.9 C)  TempSrc: Oral Oral Oral Oral  SpO2: 100% 100% 100% 99%  Weight: 199 lb 11.2 oz (90.6 kg)     Height: 6' (1.829 m)      Physical Exam  Constitutional: He is oriented to person, place, and time. He appears well-developed and well-nourished. No distress.  HENT:  Mouth/Throat: Oropharynx is clear and moist.  Small, well circumscribed 1 cm in diameter circular hematoma without overlying erythema, ecchymosis, or ulceration on R forehead which is tender to palpation.  Eyes: Pupils are equal, round, and reactive to light. Conjunctivae and EOM are normal.  Cardiovascular: Normal rate, regular rhythm and intact distal pulses.  Exam reveals no friction rub.   No murmur heard. Pulmonary/Chest: Effort  normal. No respiratory distress. He has no wheezes. He has no rales.  Abdominal: Soft. He exhibits no distension. There is no tenderness. There is no guarding.  Musculoskeletal: He exhibits no edema (of bilateral lower extermity) or tenderness (of bilateral lower extremities).  Neurological: He is alert and oriented to person, place, and time.  Face strength and sensation intact bilaterally. Tongue midline. 5/5 bicep,  tricep, and grip strength bilaterally. 5/5 hip flexion, dorsiflexion, and plantarflexion bilaterally. Gross sensation to light touch of upper and lower extremities intact bilaterally.  Skin: Skin is warm and dry. Capillary refill takes less than 2 seconds. No rash noted. No erythema.  Psychiatric: His behavior is normal. Thought content normal.   Assessment/Plan:  Active Problems:   Syncope   Stroke (cerebrum) [facility])  Mr Reed Dady is a 61 yo with a PMH of hypertension and recent R ACA stroke who presented after a syncopal episode and LLE weakness. A code stroke was called in the ED and his head CT showed evidence of known ACA infarction without new ICA or MCA infarct. He was admitted to the internal medicine teaching service with neurology consulting for management. The specific problems addressed during his admission were as follows:  LLE weakness and recent history of R ACA stroke (07/05/17): Patient previously presented with similar symptoms, which occurred x3 times over the course of a few hours prior to identifying his R ACA stroke. Neurology consult in ED suggested that his symptoms of LLE weakness may have been a result of new stroke, progression of previous stroke, or a result of symptomatic hypotension. The patient's brain MRI showed small extension of previous infarct, which now measures 2 x 2.5 cm as opposed to 1 x 1.5 cm on previous imaging. There was also an area of focal swelling on frontal scalp with possible small, underlying subarachnoid hemorrhage that likely resulted from traumatic fall. The patient continues to have no neurologic deficits on exam.  -Neurology consulted, appreciate recommendations -Neuro checks q 2 hours -TTE today TEE scheduled for Friday 07/10/17 -Continue home aspirin 325 mg and Plavix 75 mg -PT/OT evaluation and treat  Syncope: Patient had an episode of syncope prior to presentation, which was described as a fall from standing 2/2 LLE weakness. The  patient's denied palpitations, chest pain, and dizziness prior to "blacking out". He did notice that he was diaphoretic and cold after he woke up. His syncope could have been a result of orthostatic hypotension, as his BP was in 60s/40s when EMS arrived. It may have also been a vasovagal event given his description of symptoms after he woke up. EKG on admission showed sinus rhythm. Overnight telemetry revealed possible 1st degree AV block and no episodes of A-Fib. -Continue telemetry while inpatient -Orthostatic vital signs not gotten in ED, will obtain now to see if patient has symptoms of hypotension during exam  Hx of Hypertension: Patient's BP has been within normal limits since fluid resuscitation in ED. Will alllow permissive hypertension given stroke, so holding home medications.  -Home regimen includes HCTZ 25 mg and losartan 100 mg daily, not started while inpatient  Fluids: None Diet: Regular diet since swallow study passed DVT prophylaxis: SCDs, ambulate as able  Dispo: Anticipated discharge in approximately 2-3 day(s) pending further workup of his stroke and syncope.   Rozann Lesches, MD 07/09/2017, 6:44 AM Pager: [phone]   Addendum: Per discussion with neurology will discontinue aspirin and Plavix while inpatient 2/2 traumatic subarachnoid hemorrhage after the fall. Upon discharge neurology suggests aspirin 81  mg only and no plavix. Will execute these recommendations today.

## 2017-07-09 NOTE — Evaluation (Signed)
Physical Therapy Evaluation & Discharge Patient Details Name: Tyler Gomez MRN: [id number] DOB: 11/02/1955 Today's Date: 07/09/2017   History of Present Illness  Pt is a 61 y/o male admitted secondary to having a syncopal episode and reported L LE weakness. Pt very recently admitted secondary to R ACA on 9/30. CT revealed expected evolutionary change of previously identified R frontal infarct. PMH including but no limited to HTN.  Clinical Impression  Pt presented supine in bed with HOB elevated, awake and willing to participate in therapy session. Pt's spouse present throughout. Prior to admission, pt reported that he was independent with all functional mobility and ADLs. 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 Pt ambulated in hallway with supervision without use of an AD, no instability or LOB. Pt also participated in higher level balance assessment and scored a 23/24 on the DGI, indicating that he is a safe community ambulator. No further acute PT needs identified at this time. PT signing off.    Follow Up Recommendations No PT follow up    Equipment Recommendations  None recommended by PT    Recommendations for Other Services       Precautions / Restrictions Precautions Precautions: Fall Restrictions Weight Bearing Restrictions: No      Mobility  Bed Mobility Overal bed mobility: Modified Independent                Transfers Overall transfer level: Modified independent Equipment used: None                Ambulation/Gait Ambulation/Gait assistance: Supervision Ambulation Distance (Feet): 500 Feet Assistive device: None Gait Pattern/deviations: Step-through pattern;WFL(Within Functional Limits) Gait velocity: WFL, able to fluctuate gait speed Gait velocity interpretation: at or above normal speed for age/gender General Gait Details: no instability or LOB without use of any AD. pt participated in  higher level balance assessment with no issues  Stairs            Wheelchair Mobility    Modified Rankin (Stroke Patients Only) Modified Rankin (Stroke Patients Only) Pre-Morbid Rankin Score: No symptoms Modified Rankin: No symptoms     Balance Overall balance assessment: Needs assistance Sitting-balance support: Feet supported Sitting balance-Leahy Scale: Normal     Standing balance support: During functional activity;No upper extremity supported Standing balance-Leahy Scale: Good                   Standardized Balance Assessment Standardized Balance Assessment : Dynamic Gait Index   Dynamic Gait Index Level Surface: Normal Change in Gait Speed: Normal Gait with Horizontal Head Turns: Normal Gait with Vertical Head Turns: Normal Gait and Pivot Turn: Normal Step Over Obstacle: Normal Step Around Obstacles: Normal Steps: Mild Impairment Total Score: 23       Pertinent Vitals/Pain Pain Assessment: No/denies pain    Home Living Family/patient expects to be discharged to:: Private residence Living Arrangements: Spouse/significant other Available Help at Discharge: Family;Available 24 hours/day Type of Home: House Home Access: Level entry     Home Layout: One level Home Equipment: None      Prior Function Level of Independence: Independent         Comments: pt works as a Scientist, forensic   Dominant Hand: Right    Extremity/Trunk Assessment   Upper Extremity Assessment Upper Extremity Assessment: Defer to OT evaluation    Lower Extremity Assessment Lower Extremity Assessment: Overall WFL for tasks assessed  Cervical / Trunk Assessment Cervical / Trunk Assessment: Normal  Communication   Communication: No difficulties  Cognition Arousal/Alertness: Awake/alert Behavior During Therapy: WFL for tasks assessed/performed Overall Cognitive Status: Within Functional Limits for tasks assessed                                         General Comments      Exercises     Assessment/Plan    PT Assessment Patent does not need any further PT services  PT Problem List         PT Treatment Interventions      PT Goals (Current goals can be found in the Care Plan section)  Acute Rehab PT Goals Patient Stated Goal: return home    Frequency     Barriers to discharge        Co-evaluation               AM-PAC PT "6 Clicks" Daily Activity  Outcome Measure Difficulty turning over in bed (including adjusting bedclothes, sheets and blankets)?: None Difficulty moving from lying on back to sitting on the side of the bed? : None Difficulty sitting down on and standing up from a chair with arms (e.g., wheelchair, bedside commode, etc,.)?: None Help needed moving to and from a bed to chair (including a wheelchair)?: None Help needed walking in hospital room?: None Help needed climbing 3-5 steps with a railing? : None 6 Click Score: 24    End of Session Equipment Utilized During Treatment: Gait belt Activity Tolerance: Patient tolerated treatment well Patient left: in chair;with call bell/phone within reach;with family/visitor present Nurse Communication: Mobility status PT Visit Diagnosis: Other symptoms and signs involving the nervous system (R29.898)    Time: 6578-4696 PT Time Calculation (min) (ACUTE ONLY): 19 min   Charges:   PT Evaluation $PT Eval Moderate Complexity: 1 Mod     PT G Codes:        [place], PT, DPT [phone]   Alessandra Bevels Jaiden Wahab 07/09/2017, 10:02 AM

## 2017-07-09 NOTE — Therapy (Signed)
OT Cancellation Note  Patient Details Name: Tyler Gomez MRN: [id number] DOB: Oct 19, 1955   Cancelled Treatment:    Reason Eval/Treat Not Completed: Patient at procedure or test/ unavailable (MRI ). Will return later.   Cammy Copa 07/09/2017, 9:42 AM

## 2017-07-09 NOTE — Progress Notes (Signed)
    [facility] has been requested to perform a transesophageal echocardiogram on Mr. Davidow for stoke.  After careful review of history and examination, the risks and benefits of transesophageal echocardiogram have been explained including risks of esophageal damage, perforation (1:10,000 risk), bleeding, pharyngeal hematoma as well as other potential complications associated with conscious sedation including aspiration, arrhythmia, respiratory failure and death. Alternatives to treatment were discussed, questions were answered. Patient is willing to proceed.  TEE - Dr. Duke Salvia @ 1500. NPO after midnight. Meds with sips.   Maddex Garlitz, PA-C 07/09/2017 4:00 PM

## 2017-07-09 NOTE — Therapy (Signed)
Occupational Therapy Evaluation and Discharge Patient Details Name: Tyler Gomez MRN: [id number] DOB: 1956/05/19 Today's Date: 07/09/2017    History of Present Illness Pt is a 61 y/o male admitted secondary to having a syncopal episode and reported L LE weakness. Pt very recently admitted secondary to R ACA on 9/30. CT revealed expected evolutionary change of previously identified R frontal infarct. PMH including but no limited to HTN.   Clinical Impression   Pt reports being independent in ADLs and working full time PTA. Currently, pt appears to have returned to baseline. Pt reports wife is available to assist as needed upon d/c. No further acute OT needs at this time. OT signing off.     Follow Up Recommendations  Supervision - Intermittent;No OT follow up    Equipment Recommendations  None recommended by OT    Recommendations for Other Services       Precautions / Restrictions Precautions Precautions: Fall Restrictions Weight Bearing Restrictions: No      Mobility Bed Mobility               General bed mobility comments: Pt sitting in chair upon OT arrival.   Transfers                      Balance   Sitting-balance support: Feet supported Sitting balance-Leahy Scale: Normal                                     ADL either performed or assessed with clinical judgement   ADL Overall ADL's : At baseline                                             Vision         Perception     Praxis      Pertinent Vitals/Pain Pain Assessment: No/denies pain     Hand Dominance Right   Extremity/Trunk Assessment Upper Extremity Assessment Upper Extremity Assessment: Overall WFL for tasks assessed   Lower Extremity Assessment Lower Extremity Assessment: Defer to PT evaluation   Cervical / Trunk Assessment Cervical / Trunk Assessment: Normal   Communication Communication Communication: No difficulties    Cognition Arousal/Alertness: Awake/alert Behavior During Therapy: WFL for tasks assessed/performed Overall Cognitive Status: Within Functional Limits for tasks assessed                                     General Comments  Pt reports that he has returned to baseline. Pts primary concern is about timeline for safe return to work.     Exercises     Shoulder Instructions      Home Living Family/patient expects to be discharged to:: Private residence Living Arrangements: Spouse/significant other Available Help at Discharge: Family;Available 24 hours/day Type of Home: House Home Access: Level entry     Home Layout: One level     Bathroom Shower/Tub: Chief Strategy Officer: Standard Bathroom Accessibility: Yes   Home Equipment: None      Lives With: Spouse    Prior Functioning/Environment Level of Independence: Independent        Comments: Pt with recent stroke but reports no deficits. 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         OT Problem List:        OT Treatment/Interventions:      OT Goals(Current goals can be found in the care plan section) Acute Rehab OT Goals Patient Stated Goal: return home OT Goal Formulation: With patient  OT Frequency:     Barriers to D/C:            Co-evaluation              AM-PAC PT "6 Clicks" Daily Activity     Outcome Measure Help from another person eating meals?: None Help from another person taking care of personal grooming?: None Help from another person toileting, which includes using toliet, bedpan, or urinal?: None Help from another person bathing (including washing, rinsing, drying)?: None Help from another person to put on and taking off regular upper body clothing?: None Help from another person to put on and taking off regular lower body clothing?: None 6 Click Score: 24   End of Session Nurse Communication: Mobility status  Activity Tolerance: Patient  tolerated treatment well Patient left: in chair;with call bell/phone within reach  OT Visit Diagnosis: Other symptoms and signs involving cognitive function                Time: 1324-4010 OT Time Calculation (min): 20 min Charges:    G-Codes:     Cammy Copa, OTS [phone]   Cammy Copa 07/09/2017, 5:31 PM

## 2017-07-10 ENCOUNTER — Encounter (HOSPITAL_COMMUNITY)
Admission: EM | Disposition: A | Discharge status: Home / Self Care | Payer: Self-pay | Source: Home / Self Care | Attending: Internal Medicine

## 2017-07-10 ENCOUNTER — Inpatient Hospital Stay (HOSPITAL_COMMUNITY)
Admit: 2017-07-10 | Discharge: 2017-07-10 | Disposition: A | Discharge status: Home / Self Care | Payer: Managed Care, Other (non HMO) | Attending: Physician Assistant | Admitting: Physician Assistant

## 2017-07-10 ENCOUNTER — Encounter (HOSPITAL_COMMUNITY): Payer: Self-pay | Admitting: Nurse Practitioner

## 2017-07-10 ENCOUNTER — Inpatient Hospital Stay (HOSPITAL_COMMUNITY): Payer: Managed Care, Other (non HMO)

## 2017-07-10 DIAGNOSIS — I639 Cerebral infarction, unspecified: Secondary | ICD-10-CM

## 2017-07-10 DIAGNOSIS — I6389 Other cerebral infarction: Secondary | ICD-10-CM

## 2017-07-10 HISTORY — PX: TEE WITHOUT CARDIOVERSION: SHX5443

## 2017-07-10 HISTORY — PX: LOOP RECORDER INSERTION: EP1214

## 2017-07-10 SURGERY — LOOP RECORDER INSERTION

## 2017-07-10 SURGERY — ECHOCARDIOGRAM, TRANSESOPHAGEAL
Anesthesia: Moderate Sedation

## 2017-07-10 MED ORDER — FENTANYL CITRATE (PF) 100 MCG/2ML IJ SOLN
INTRAMUSCULAR | Status: DC | PRN
  Administered 2017-07-10 (×2): 25 ug via INTRAVENOUS

## 2017-07-10 MED ORDER — FENTANYL CITRATE (PF) 100 MCG/2ML IJ SOLN
INTRAMUSCULAR | Status: AC
  Filled 2017-07-10: qty 2

## 2017-07-10 MED ORDER — SODIUM CHLORIDE 0.9 % IV SOLN: INTRAVENOUS | Status: DC

## 2017-07-10 MED ORDER — BUPIVACAINE HCL (PF) 0.25 % IJ SOLN: INTRAMUSCULAR | Status: DC | PRN

## 2017-07-10 MED ORDER — ACETAMINOPHEN 325 MG PO TABS: 325.0000 mg | ORAL_TABLET | ORAL | Status: DC | PRN

## 2017-07-10 MED ORDER — ONDANSETRON HCL 4 MG/2ML IJ SOLN: 4.0000 mg | Freq: Four times a day (QID) | INTRAMUSCULAR | Status: DC | PRN

## 2017-07-10 MED ORDER — ASPIRIN EC 81 MG PO TBEC: 81.0000 mg | DELAYED_RELEASE_TABLET | Freq: Every day | ORAL | 3 refills | Status: AC

## 2017-07-10 MED ORDER — MIDAZOLAM HCL 10 MG/2ML IJ SOLN
INTRAMUSCULAR | Status: DC | PRN
  Administered 2017-07-10 (×2): 2 mg via INTRAVENOUS

## 2017-07-10 MED ORDER — LIDOCAINE-EPINEPHRINE 1 %-1:100000 IJ SOLN
INTRAMUSCULAR | Status: AC
  Filled 2017-07-10: qty 1

## 2017-07-10 MED ORDER — BUTAMBEN-TETRACAINE-BENZOCAINE 2-2-14 % EX AERO
INHALATION_SPRAY | CUTANEOUS | Status: DC | PRN
  Administered 2017-07-10: 2 via TOPICAL

## 2017-07-10 MED ORDER — DIPHENHYDRAMINE HCL 50 MG/ML IJ SOLN
INTRAMUSCULAR | Status: AC
  Filled 2017-07-10: qty 1

## 2017-07-10 MED ORDER — MIDAZOLAM HCL 5 MG/ML IJ SOLN
INTRAMUSCULAR | Status: AC
  Filled 2017-07-10: qty 2

## 2017-07-10 MED ORDER — LIDOCAINE-EPINEPHRINE 1 %-1:100000 IJ SOLN
INTRAMUSCULAR | Status: DC | PRN
  Administered 2017-07-10: 15 mL

## 2017-07-10 SURGICAL SUPPLY — 2 items
LOOP REVEAL LINQSYS (Prosthesis & Implant Heart) ×2 IMPLANT
PACK LOOP INSERTION (CUSTOM PROCEDURE TRAY) ×2 IMPLANT

## 2017-07-10 NOTE — Progress Notes (Signed)
STROKE TEAM PROGRESS NOTE   HISTORY OF PRESENT ILLNESS (per record) Tyler Gomez is a 61 y.o. male was a past medical history of hypertension and a recent right ACA stroke, for which she received workup at this hospital and was discharged 2 days ago, presents again via EMS for evaluation of left-sided weakness which followed an episode of syncope at work this morning. The patient was in his usual state of health, woke up at 5:30 AM this morning, went to work. He is a Chartered certified accountant. He was at work and had a episode of "passing out". When he came to, he was unable to lift his left arm or leg. Unknown if his speech was slurred or if he had any facial weakness. When EMS arrived, they noted left arm and leg drift. Upon arrival into the emergency room, his symptoms had completely resolved. Of note, when EMS were evaluating him and initially, his systolic blood pressure was in the 60s-62/40, which increased to 88/64 after receiving some IV fluids. While reviewing his chart, upon last admission, he had bradycardia with heart rate in the 30s. He received a stroke workup during his last admission as documented in epic. He was recommended to be discharged on dual antiplatelet therapy for 3 weeks followed by aspirin only. He was advised to follow-up with outpatient neurology. He was advised to get an outpatient TEE and possible loop recorder. None of the follow-ups had occurred yet since he was only discharged 2 days ago. He denies any preceding fevers or chills or having felt sick. Other review of systems is negative.   LKW: 5:30 AM on 07/08/2017 tpa given?: no, NIH 0, stroke less than a week ago Premorbid modified Rankin scale (mRS): 0  SUBJECTIVE (INTERVAL HISTORY) His wife is at the bedside.     He states his back to his neurological baseline.He is awaiting TEE followed by loop recorder later today   OBJECTIVE Temp:  [98.3 F (36.8 C)-99 F (37.2 C)] 98.3 F (36.8 C) (10/05 0943) Pulse Rate:   [57-62] 57 (10/05 0943) Cardiac Rhythm: Sinus bradycardia;Normal sinus rhythm (10/05 0700) Resp:  [18] 18 (10/05 0943) BP: (112-133)/(69-86) 122/71 (10/05 0943) SpO2:  [100 %] 100 % (10/05 0943)  CBC:  Recent Labs Lab 07/05/17 1010  07/08/17 1103 07/09/17 0812  WBC 4.5  --  5.7 5.7  NEUTROABS 3.1  --  4.4  --   HGB 12.8*  < > 12.1* 11.9*  HCT 39.1  < > 37.5* 35.2*  MCV 84.6  --  85.4 85.0  PLT 239  --  216 208  < > = values in this interval not displayed.  Basic Metabolic Panel:   Recent Labs Lab 07/08/17 1103 07/09/17 0812  NA 136 138  K 3.4* 3.5  CL 104 109  CO2 23 22  GLUCOSE 101* 99  BUN 10 6  CREATININE 1.08 0.82  CALCIUM 9.0 8.8*    Lipid Panel:     Component Value Date/Time   CHOL 135 07/06/2017 0502   CHOL 185 08/28/2015 1158   TRIG 79 07/06/2017 0502   HDL 28 (L) 07/06/2017 0502   HDL 34 (L) 08/28/2015 1158   CHOLHDL 4.8 07/06/2017 0502   VLDL 16 07/06/2017 0502   LDLCALC 91 07/06/2017 0502   LDLCALC 120 (H) 08/28/2015 1158   HgbA1c:  Lab Results  Component Value Date   HGBA1C 5.9 (H) 07/06/2017   Urine Drug Screen: No results found for: LABOPIA, COCAINSCRNUR, LABBENZ, AMPHETMU, THCU, LABBARB  Alcohol  Level No results found for: [facility]  IMAGING  Dg Chest 2 View 07/08/2017 IMPRESSION: Mild cardiomegaly.  No acute cardiopulmonary abnormality.  Ct Head Wo Contrast 07/08/2017 IMPRESSION: 1. Expected evolutionary change of the previously identified ACA territory RIGHT frontal infarct. No hemorrhagic transformation or new areas of ICA/MCA infarction. 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 2. ASPECTS is 10.  MRI Brain Wo contrast 07/09/2017 Slight enlargement of the region of infarction in the right frontal lobe, now measuring about 2 x 2.5 cm as opposed to 1 x 1.5 cm on the previous study. No evidence of mass effect or parenchymal hemorrhage in this location.  PHYSICAL EXAM Pleasant middle-aged African-American male currently  not in distress.Right frontal small scalp hematoma. . Afebrile. Head is nontraumatic. Neck is supple without bruit.    Cardiac exam no murmur or gallop. Lungs are clear to auscultation. Distal pulses are well felt. Neurological Exam ;  Awake  Alert oriented x 3. Normal speech and language.eye movements full without nystagmus.fundi were not visualized. Vision acuity and fields appear normal. Hearing is normal. Palatal movements are normal. Face symmetric. Tongue midline. Normal strength, tone, reflexes and coordination. Normal sensation. Gait deferred.  ASSESSMENT/PLAN [name] is a 61 y.o. male with history of hypertension, very recent ACA stroke presenting with left sided weakness and syncopal episode at work. He did not receive IV t-PA due to very recent stroke.   Subacute stroke symptoms worsened after syncopal episode:  right frontal lobe infarct previously identified with slight enlargement from 1 x 1.5cm to 2 x 2.5cm. His new subaracnoid hemorrhage is postraumatic contrecoup from fall with associated scalp hematoma on aspirin .  Resultant  Returned to baseline left sided weakness  CT head Expected evolutionary changes of previous ACA infarct  MRI head Small expansion of R ACA infarct, possible small  Enoxaparin for VTE prophylaxis Diet NPO time specified Except for: Sips with Meds  aspirin 325 mg daily, plavix  daily prior to admission, recommend aspirin  plavix   Patient counseled to be compliant with his antithrombotic medications  Ongoing aggressive stroke risk factor management  No need for repeat extensive CVA evaluation at this time  Hypertension Low blood pressure on arrival, possibly contributing to focal hypoperfusion in setting of recent emboic stroke  Hyperlipidemia  Recently checked LDL 91 > goal 70  Continue atorvastatin   Diabetes  Recently checked Hgb A1c 5.9% < goal 7.0%  Other Stroke Risk Factors  Advanced  age  Overweight, Body mass index is 27.08 kg/m., recommend weight loss, diet and exercise as appropriate   Hx stroke/TIA  Other Active Problems  Hypotension, inappropriately low heart rate. Consider cardiology or EP evaluation vs medications.  Continue aspirin daily at  with plavix DAPT  Hospital day # 2 I have personally examined this patient, reviewed notes, independently viewed imaging studies, participated in medical decision making and plan of care.ROS completed by me personally and pertinent positives fully documented  I have made any additions or clarifications directly to the above note.  Patient presented with transient worsening of her recent stroke symptoms in the setting of a syncopal episode and mild concussion. He has small posttraumatic subarachnoid hemorrhage. Recommend hold aspirin for a few days and then restart 81 mg daily. Do not resume Plavix. Long discussion with patient and wife and answered questions.  . Discussed with Dr Sandre Kitty , medical residents and EP team NP.  No need for any further stroke workup. Follow-up as an outpatient in stroke  clinic in 6 weeks. Stroke team  will sign off. Delia Heady, MD Medical Director [facility] Pager: [phone] 07/10/2017 1:23 PM    To contact Stroke Continuity provider, please refer to [email or web address]. After hours, contact General Neurology

## 2017-07-10 NOTE — H&P (Signed)
Tyler Gomez is a 61 y.o. male who has presented today for surgery, with the diagnosis of stroke.  The various methods of treatment have been discussed with the patient and family. After consideration of risks, benefits and other options for treatment, the patient has consented to Procedure(s): TRANSESOPHAGEAL ECHOCARDIOGRAM (TEE) (N/A) as a surgical intervention . The patient's history has been reviewed, patient examined, no change in status, stable for surgery. I have reviewed the patient's chart and labs. Questions were answered to the patient's satisfaction.   Rogerick Baldwin C. Duke Salvia, MD, [facility]  07/10/2017 2:30 PM

## 2017-07-10 NOTE — Progress Notes (Signed)
Pt being discharged home via wheelchair with family. Pt alert and oriented x4. VSS. Pt c/o no pain at this time. No signs of respiratory distress. Education complete and care plans resolved. IV removed with catheter intact and pt tolerated well. No further issues at this time. Pt to follow up with PCP. Edda Orea R, RN 

## 2017-07-10 NOTE — Interval H&P Note (Signed)
History and Physical Interval Note:  07/10/2017 3:32 PM  Tyler Gomez  has presented today for surgery, with the diagnosis of stroke  The various methods of treatment have been discussed with the patient and family. After consideration of risks, benefits and other options for treatment, the patient has consented to  Procedure(s): LOOP RECORDER INSERTION (N/A) as a surgical intervention .  The patient's history has been reviewed, patient examined, no change in status, stable for surgery.  I have reviewed the patient's chart and labs.  Questions were answered to the patient's satisfaction.     Lewayne Bunting

## 2017-07-10 NOTE — Progress Notes (Signed)
  Echocardiogram Echocardiogram Transesophageal has been performed.  Tyler Gomez F 07/10/2017, 3:26 PM

## 2017-07-10 NOTE — Progress Notes (Signed)
Subjective:  Patient seen sitting comfortably in chair this AM in no acute distress. Patient states that he feels well and has no acute complaints. He has not had any episodes of LLE weakness, chest pain, or headaches. He is excited to have his procedure done today and states that he is ready to go home.   Objective:  Vital signs in last 24 hours: Vitals:   07/10/17 1524 07/10/17 1530 07/10/17 1540 07/10/17 1629  BP: (!) 154/95 (!) 154/95 (!) 158/88 137/72  Pulse: 64 61 (!) 59 (!) 59  Resp: Temp:    97.9 F (36.6 C)  TempSrc: Oral   Oral  SpO2: 100% 100% 100% 100%  Weight:      Height:       Physical Exam  Constitutional: He appears well-developed and well-nourished. No distress.  Eyes: EOM are normal.  Cardiovascular: Normal rate and regular rhythm.  Exam reveals no friction rub.   No murmur heard. Pulmonary/Chest: Effort normal. No respiratory distress. He has no wheezes. He has no rales.  Abdominal: Soft. He exhibits no distension. There is no tenderness. There is no guarding.  Musculoskeletal: He exhibits no edema (of bilateral lower extremities) or tenderness (of bilateral lower extremities).  Neurological:  Face strength and sensation intact bilaterally. Tongue midline. 5/5 bicep, tricep, and grip strength bilaterally. 5/5 hip flexion, dorsiflexion, and plantarflexion bilaterally. Gross sensation to light touch of upper and lower extremities intact bilaterally.  Skin: Skin is warm and dry. No rash noted. No erythema.   Assessment/Plan:  Principal Problem:   Syncope Active Problems:   CVA (cerebral vascular accident) (HCC)   Stroke (cerebrum) [facility])  Mr Dawon Troop is a 61 yo with a PMH of hypertension and recent R ACA stroke who presented after a syncopal episode and LLE weakness. A code stroke was called in the ED and his head CT showed evidence of known ACA infarction without new ICA or MCA infarct. He was admitted to the internal medicine teaching  service with neurology and cardiology consulting for management. The specific problems addressed during his admission were as follows:  LLE weakness and recent history of R ACA stroke (07/05/17): Patient previously presented with similar symptoms when he first presented with acute R ACA stroke. The patient's brain MRI showed small extension of previous infarct, which now measures 2 x 2.5 cm as opposed to 1 x 1.5 cm on previous imaging, but no evidence of new stroke. There was also a small, underlying subarachnoid hemorrhage identified as a result of traumatic fall. The patient continues to have no neurologic deficits on exam. The patient had TEE today which showed normal ejection fraction without evidence of LA thrombus which could explain original stroke.  -Neurology signed off, will follow up as outpatient in 6 weeks -Neuro checks q 2 hours -Aspirin 81 mg at discharge, holding antiplatelet therapy with new finding of SAH -PT/OT evaluation and treat  Syncope: Patient had an episode of syncope prior to presentation. His syncope most likely resulted from hypotension 2/2 volume depletion or possible underlying arrhythmia. EKG on admission showed sinus rhythm. Overnight telemetry has mainly revealed 1st degree AV block without a-fib. Patient obtained implantable loop recorder with EP today and patient will follow up with neurology/cardiology as outpatient regarding results and possible anti-coagulation if A-Fib identified. -Continue telemetry while inpatient  Hx of Hypertension: Patient's BP has been within normal limits will restart home HCTZ 25 mg and losartan 100 mg daily upon discharge.  Fluids: None Diet: Regular  DVT prophylaxis: SCDs, ambulate as able  Dispo: Anticipated discharge in approximately 0-1 day.   Rozann Lesches, MD 07/10/2017, 5:07 PM Pager: [phone]

## 2017-07-10 NOTE — Consult Note (Signed)
ELECTROPHYSIOLOGY CONSULT NOTE  Patient ID: Tyler Gomez MRN: [id number], DOB/AGE: Mar 04, 1962   Admit date: 07/08/2017 Date of Consult: 07/10/2017  Primary Physician: Ellyn Hack, MD Primary Cardiologist: new to [facility] Reason for Consultation: Cryptogenic stroke; recommendations regarding Implantable Loop Recorder  History of Present Illness EP has been asked to evaluate Benito Mccreedy for placement of an implantable loop recorder to monitor for atrial fibrillation by Dr Pearlean Brownie.  The patient was admitted on 07/08/2017 with syncope. He was recently discharged with ACA stroke and outpatient TEE/ILR was recommended.  His syncope occurred while standing at home. He had preceding dizziness and felt like he was going to pass out.  He has had syncope in the past dating back 10 years.  Some episodes occur while sitting. He has been evaluated and concern was for hypoglycemia.  EMS records mention bradycardia, however, I am unable to find documentation in run strips. He was hypotensive on their arrival and responded to fluid.   He has undergone workup for stroke including carotid imaging.  The patient has been monitored on telemetry which has demonstrated sinus rhythm with no arrhythmias.  Inpatient stroke work-up is to be completed with a TEE.   Echocardiogram pending.   Prior to admission, the patient denies chest pain, shortness of breath, dizziness, palpitations, or syncope.  They are recovering from their stroke with plans to return home at discharge.  Past Medical History:  Diagnosis Date  . Hypertension   . Stroke (cerebrum) (HCC)   . Syncope      Surgical History: History reviewed. No pertinent surgical history.   Prescriptions Prior to Admission  Medication Sig Dispense Refill Last Dose  . aspirin EC 325 MG EC tablet Take 1 tablet (325 mg total) by mouth daily. 30 tablet 0 07/08/2017 at Unknown time  . atorvastatin (LIPITOR) 40 MG tablet Take 1 tablet (40 mg total) by  mouth daily at 6 PM. 30 tablet 0 07/07/2017 at Unknown time  . clopidogrel (PLAVIX) 75 MG tablet Take 1 tablet (75 mg total) by mouth daily. 21 tablet 0 07/08/2017 at Unknown time  . hydrochlorothiazide (HYDRODIURIL) 25 MG tablet Take 25 mg by mouth daily.   07/08/2017 at Unknown time  . losartan (COZAAR) 100 MG tablet Take 100 mg by mouth daily.   07/08/2017 at Unknown time  . potassium chloride SA (K-DUR,KLOR-CON) 20 MEQ tablet Take 1 tablet (20 mEq total) by mouth 3 (three) times daily. 90 tablet 0 07/08/2017 at Unknown time  . fluticasone (FLONASE) 50 MCG/ACT nasal spray Place 2 sprays into both nostrils daily. (Patient taking differently: Place 2 sprays into both nostrils daily as needed for allergies. ) 16 g 1 prn  . losartan-hydrochlorothiazide (HYZAAR) 100-25 MG tablet Take 1 tablet by mouth daily. (Patient not taking: Reported on 07/08/2017) 90 tablet 1 Not Taking at Unknown time    Inpatient Medications:  . atorvastatin  40 mg Oral q1800    Allergies: No Known Allergies  Social History   Social History  . Marital status: Married    Spouse name: N/A  . Number of children: N/A  . Years of education: N/A   Occupational History  . Not on file.   Social History Main Topics  . Smoking status: Never Smoker  . Smokeless tobacco: Never Used  . Alcohol use No  . Drug use: No  . Sexual activity: Yes   Other Topics Concern  . Not on file   Social History Narrative  . No  narrative on file     Family History  Problem Relation Age of Onset  . Heart attack Father       Review of Systems: All other systems reviewed and are otherwise negative except as noted above.  Physical Exam: Vitals:   07/09/17 1742 07/09/17 2151 07/10/17 0139 07/10/17 0533  BP: 132/77 133/74 124/69 133/86  Pulse: (!) 57 62 60 61  Resp: Temp: 98.4 F (36.9 C) 99 F (37.2 C) 98.9 F (37.2 C) 98.4 F (36.9 C)  TempSrc: Oral Oral Oral Oral  SpO2: 100% 100% 100% 100%  Weight:        Height:        GEN- The patient is well appearing, alert and oriented x 3 today.   Head- normocephalic, atraumatic Eyes-  Sclera clear, conjunctiva pink Ears- hearing intact Oropharynx- clear Neck- supple Lungs- Clear to ausculation bilaterally, normal work of breathing Heart- Regular rate and rhythm, no murmurs, rubs or gallops  GI- soft, NT, ND, + BS Extremities- no clubbing, cyanosis, or edema MS- no significant deformity or atrophy Skin- no rash or lesion Psych- euthymic mood, full affect   Labs:   Lab Results  Component Value Date   WBC 5.7 07/09/2017   HGB 11.9 (L) 07/09/2017   HCT 35.2 (L) 07/09/2017   MCV 85.0 07/09/2017   PLT 208 07/09/2017     Recent Labs Lab 07/08/17 1103 07/09/17 0812  NA 136 138  K 3.4* 3.5  CL 104 109  CO2 23 22  BUN 10 6  CREATININE 1.08 0.82  CALCIUM 9.0 8.8*  PROT 8.1  --   BILITOT 0.8  --   ALKPHOS 47  --   ALT 39  --   AST 43*  --   GLUCOSE 101* 99     Radiology/Studies: Ct Angio Head W Or Wo Contrast  Result Date: 07/05/2017 CLINICAL DATA:  Left-sided weakness. EXAM: CT ANGIOGRAPHY HEAD AND NECK TECHNIQUE: Multidetector CT imaging of the head and neck was performed using the standard protocol during bolus administration of intravenous contrast. Multiplanar CT image reconstructions and MIPs were obtained to evaluate the vascular anatomy. Carotid stenosis measurements (when applicable) are obtained utilizing NASCET criteria, using the distal internal carotid diameter as the denominator. CONTRAST:  50 mL Isovue 370 COMPARISON:  Noncontrast head CT earlier today FINDINGS: CTA NECK FINDINGS Aortic arch: Standard 3 vessel aortic arch with mild atherosclerotic plaque. Widely patent brachiocephalic and subclavian arteries. Right carotid system: Patent with mild scattered intimal thickening and luminal irregularity. No stenosis or dissection. Left carotid system: Patent with mild scattered intimal thickening and luminal irregularity.  Minimal calcified plaque in the proximal ICA. No stenosis or dissection. Vertebral arteries: Patent and codominant without evidence of stenosis or dissection. Mild non stenotic calcified plaque at the right vertebral artery origin. Skeleton: No acute osseous abnormality or suspicious osseous lesion. Other neck: No mass or lymph node enlargement. Upper chest: Clear lung apices. Review of the MIP images confirms the above findings CTA HEAD FINDINGS Anterior circulation: The internal carotid arteries are patent from skullbase to carotid termini with left greater than right siphon atherosclerosis. There is mild-to-moderate stenosis of the supraclinoid and cavernous segments on the left with minimal to mild narrowing on the right. The sellar and suprasellar mass extending over the right posterior clinoid process does not demonstrate arterial enhancement and does not reflect an aneurysm. The MCAs are patent without M1 stenosis. There is moderate MCA branch vessel irregularity and narrowing  bilaterally without evidence of proximal branch occlusion. The left A1 segment is not visualized and is likely congenitally absent. The right A1 segment is widely patent and supplies the left ACA. There is moderate ACA branch vessel irregularity and narrowing with asymmetric right A2 attenuation and missing right A3 branch vessels. Posterior circulation: The intracranial vertebral arteries are patent to the basilar with atherosclerotic irregularity bilaterally. There are mild right and severe left V4 segment stenoses, with evaluation mildly limited by beam hardening. The basilar artery is patent and diffusely small, presumably on a congenital basis, with superimposed irregularity and mild narrowing. Proximal SCAs are grossly patent. The PCAs are patent and diffusely small in caliber without evidence of flow limiting proximal stenosis. The mass along the posterior right clinoid process abuts the proximal right PCA without vascular  encasement. No aneurysm. Venous sinuses: Limited assessment by contrast timing. Anatomic variants: Absent left A1. Delayed phase: Not performed. Review of the MIP images confirms the above findings IMPRESSION: 1. No emergent large vessel occlusion. 2. Intracranial atherosclerosis with mild right and moderate left ICA stenoses and diffuse branch vessel irregularity. 3. Right A2 attenuation with missing right ACA branch vessels. 4. Diminutive posterior circulation with mild right and severe left V4 stenoses and mild basilar narrowing. 5. Posterior right clinoid and sellar mass which does not reflect an aneurysm. An MRI is pending for further evaluation. 6. Widely patent cervical carotid arteries. Preliminary findings were discussed via telephone with Dr. Amada Jupiter on 07/05/2017 at 10:40 a.m. Electronically Signed   By: Sebastian Ache M.D.   On: 07/05/2017 11:21   Dg Chest 2 View  Result Date: 07/08/2017 CLINICAL DATA:  61 year old male with shortness of breath and weakness. Recent small right ACA territory infarct. EXAM: CHEST  2 VIEW COMPARISON:  None. FINDINGS: Upper I AP and lateral views of the chest. Cardiac size is at the upper limits of normal to mildly enlarged. Other mediastinal contours are within normal limits. Visualized tracheal air column is within normal limits. The lungs are clear. No pneumothorax or pleural effusion. Negative visible bowel gas pattern. No acute osseous abnormality identified. IMPRESSION: Mild cardiomegaly.  No acute cardiopulmonary abnormality. Electronically Signed   By: Odessa Fleming M.D.   On: 07/08/2017 11:47   Ct Head Wo Contrast  Result Date: 07/08/2017 CLINICAL DATA:  LEFT-sided weakness. Possible cardiac event, hypotension, recent stroke. EXAM: CT HEAD WITHOUT CONTRAST TECHNIQUE: Contiguous axial images were obtained from the base of the skull through the vertex without intravenous contrast. COMPARISON:  CT head, CTA head, and MR head 07/05/2017. FINDINGS: Brain: Evolving  cytotoxic edema, RIGHT anterior medial frontal lobe, correlating with the area of cortical infarction noted previous. Otherwise, no new areas of suspected cerebral infarction, hemorrhage, hydrocephalus, or extra-axial fluid. 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 Vascular: Calcification of the cavernous internal carotid arteries consistent with cerebrovascular atherosclerotic disease. No signs of intracranial large vessel occlusion. Skull: Normal. Negative for fracture or focal lesion. Sinuses/Orbits: No acute finding. Other: None. ASPECTS [facility] Stroke Program Early CT Score) - Ganglionic level infarction (caudate, lentiform nuclei, internal capsule, insula, M1-M3 cortex): 7 - Supraganglionic infarction (M4-M6 cortex): 3 Total score (0-10 with 10 being normal): 10 IMPRESSION: 1. Expected evolutionary change of the previously identified ACA territory RIGHT frontal infarct. No hemorrhagic transformation or new areas of ICA/MCA infarction. 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 2. ASPECTS is 10. These results were called by telephone  at the time of interpretation on 07/08/2017 at 10:20 am to Dr. Wilford Corner, who verbally acknowledged these results. Electronically Signed   By: Elsie Stain M.D.   On: 07/08/2017 10:36   Ct Angio Neck W Or Wo Contrast  Result Date: 07/05/2017 CLINICAL DATA:  Left-sided weakness. EXAM: CT ANGIOGRAPHY HEAD AND NECK TECHNIQUE: Multidetector CT imaging of the head and neck was performed using the standard protocol during bolus administration of intravenous contrast. Multiplanar CT image reconstructions and MIPs were obtained to evaluate the vascular anatomy. Carotid stenosis measurements (when applicable) are obtained utilizing NASCET criteria, using the distal internal carotid diameter as the denominator. CONTRAST:  50 mL Isovue 370 COMPARISON:   Noncontrast head CT earlier today FINDINGS: CTA NECK FINDINGS Aortic arch: Standard 3 vessel aortic arch with mild atherosclerotic plaque. Widely patent brachiocephalic and subclavian arteries. Right carotid system: Patent with mild scattered intimal thickening and luminal irregularity. No stenosis or dissection. Left carotid system: Patent with mild scattered intimal thickening and luminal irregularity. Minimal calcified plaque in the proximal ICA. No stenosis or dissection. Vertebral arteries: Patent and codominant without evidence of stenosis or dissection. Mild non stenotic calcified plaque at the right vertebral artery origin. Skeleton: No acute osseous abnormality or suspicious osseous lesion. Other neck: No mass or lymph node enlargement. Upper chest: Clear lung apices. Review of the MIP images confirms the above findings CTA HEAD FINDINGS Anterior circulation: The internal carotid arteries are patent from skullbase to carotid termini with left greater than right siphon atherosclerosis. There is mild-to-moderate stenosis of the supraclinoid and cavernous segments on the left with minimal to mild narrowing on the right. The sellar and suprasellar mass extending over the right posterior clinoid process does not demonstrate arterial enhancement and does not reflect an aneurysm. The MCAs are patent without M1 stenosis. There is moderate MCA branch vessel irregularity and narrowing bilaterally without evidence of proximal branch occlusion. The left A1 segment is not visualized and is likely congenitally absent. The right A1 segment is widely patent and supplies the left ACA. There is moderate ACA branch vessel irregularity and narrowing with asymmetric right A2 attenuation and missing right A3 branch vessels. Posterior circulation: The intracranial vertebral arteries are patent to the basilar with atherosclerotic irregularity bilaterally. There are mild right and severe left V4 segment stenoses, with evaluation  mildly limited by beam hardening. The basilar artery is patent and diffusely small, presumably on a congenital basis, with superimposed irregularity and mild narrowing. Proximal SCAs are grossly patent. The PCAs are patent and diffusely small in caliber without evidence of flow limiting proximal stenosis. The mass along the posterior right clinoid process abuts the proximal right PCA without vascular encasement. No aneurysm. Venous sinuses: Limited assessment by contrast timing. Anatomic variants: Absent left A1. Delayed phase: Not performed. Review of the MIP images confirms the above findings IMPRESSION: 1. No emergent large vessel occlusion. 2. Intracranial atherosclerosis with mild right and moderate left ICA stenoses and diffuse branch vessel irregularity. 3. Right A2 attenuation with missing right ACA branch vessels. 4. Diminutive posterior circulation with mild right and severe left V4 stenoses and mild basilar narrowing. 5. Posterior right clinoid and sellar mass which does not reflect an aneurysm. An MRI is pending for further evaluation. 6. Widely patent cervical carotid arteries. Preliminary findings were discussed via telephone with Dr. Amada Jupiter on 07/05/2017 at 10:40 a.m. Electronically Signed   By: Sebastian Ache M.D.   On: 07/05/2017 11:21   Mr Brain Wo Contrast  Result Date: 07/09/2017 CLINICAL  DATA:  Left-sided weakness. Sellar mass. Right frontal infarct. EXAM: MRI HEAD WITHOUT CONTRAST TECHNIQUE: Multiplanar, multiecho pulse sequences of the brain and surrounding structures were obtained without intravenous contrast. COMPARISON:  07/05/2017 MRI.  07/08/2017 CT. FINDINGS: Brain: There has been slight extension of the medial right frontal infarction when compared to the MR study of 07/05/2017. Previously this measured about 1 x 1.5 cm. Today this measures about 2 x 2.5 cm. Mild swelling in the region of infarction but no evidence of hemorrhage or mass effect. No new area of infarction is  identified. Elsewhere, there is a small amount of proteinaceous signal in the sulci in the occipital regions right more than left, probably either some minimal blood products or protein related to the infarction. Also, there appears to be a right frontal scalp hematoma suggesting that the patient might have recently fallen with trauma to the head. Therefore, this could be a small amount of post traumatic subarachnoid hemorrhage. Punctate foci of susceptibility artifact in the left temporal lobe were present previously and are not likely significant, probably relating either to old head trauma or tiny developmental venous anomalies. No hydrocephalus. No extra-axial fluid collection. 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 Vascular: Major vessels at the base of the brain show flow. Skull and upper cervical spine: Right frontal scalp hematoma suggesting recent fall. No bone finding. Sinuses/Orbits: Clear/normal Other: None IMPRESSION: Slight enlargement of the region of infarction in the right frontal lobe, now measuring about 2 x 2.5 cm as opposed to 1 x 1.5 cm on the previous study. No evidence of mass effect or parenchymal hemorrhage in this location. Newly seen right frontal scalp swelling, presumably hematoma secondary to a fall. Abnormal FLAIR and gradient signal in the occipital sulci right more than left. This could be proteinaceous material or blood secondary to the recent infarction or could represent a small amount of post traumatic subarachnoid hemorrhage if in fact the right frontal scalp finding does indicate recent fall. Electronically Signed   By: Paulina Fusi M.D.   On: 07/09/2017 07:54   Mr Laqueta Jean WU Contrast  Result Date: 07/05/2017 CLINICAL DATA:  Left-sided weakness which has improved. Sellar region mass on CT. EXAM: MRI HEAD WITHOUT AND WITH CONTRAST TECHNIQUE: Multiplanar, multiecho pulse sequences of the brain and surrounding structures were obtained without and  with intravenous contrast. CONTRAST:  10mL MULTIHANCE GADOBENATE DIMEGLUMINE 529 MG/ML IV SOLN COMPARISON:  Head CT and CTA earlier today FINDINGS: Brain: There is a 1 cm focus of cortical restricted diffusion involving the anterior aspect of the right cingulate gyrus. A small amount of restricted diffusion also involves the adjacent white matter along the right lateral margin of the anterior corpus callosum. A chronic microhemorrhage is noted in the posterior left temporal lobe. No significant chronic cerebral white matter disease is seen for age. The ventricles and sulci are normal in size for age. There is no midline shift or extra-axial fluid collection. Dedicated pituitary protocol imaging was performed. As seen on today's earlier CTs, there is a lobulated sellar mass which extends into the suprasellar cistern and posteriorly over the right posterior clinoid process. The mass measures 1.5 x 0.7 x 2.0 cm and is variably T1 hyperintense without appreciable enhancement. No susceptibility suggestive of blood products is identified in the posterior aspect of the mass, while the more anterior aspect is obscured on the SWI sequence. The mass mildly displaces the right aspect of the optic chiasm superiorly. The mass abuts but does not  encase the right supraclinoid ICA and proximal right PCA. A small amount of normal enhancing pituitary tissue is present along the floor of the sella. Vascular: Major intracranial vascular flow voids are preserved. Skull and upper cervical spine: No suspicious osseous lesion. Sinuses/Orbits: Unremarkable orbits. Paranasal sinuses and mastoid air cells are clear. Other: None. IMPRESSION: 1. Small acute right ACA infarct. 2. 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 Mild mass effect on the optic chiasm. Electronically Signed   By: Sebastian Ache M.D.   On: 07/05/2017 13:57   Ct Head Code Stroke Wo Contrast  Result Date:  07/05/2017 CLINICAL DATA:  Code stroke.  Left-sided weakness, improving. EXAM: CT HEAD WITHOUT CONTRAST TECHNIQUE: Contiguous axial images were obtained from the base of the skull through the vertex without intravenous contrast. COMPARISON:  None. FINDINGS: Brain: There is no evidence of acute infarct, intracranial hemorrhage, midline shift, or extra-axial fluid collection. The ventricles and sulci are normal for age. There is a lobulated 2 cm hyperdense mass in the sella and suprasellar cistern asymmetric to the right along the posterior clinoid process. Vascular: Calcified atherosclerosis at the skullbase. No hyperdense vessel. Skull: No fracture or focal osseous lesion. Sinuses/Orbits: Mild right ethmoid air cell mucosal thickening. Clear mastoid air cells. Unremarkable orbits. Other: None. ASPECTS [facility] Stroke Program Early CT Score) - Ganglionic level infarction (caudate, lentiform nuclei, internal capsule, insula, M1-M3 cortex): 7 - Supraganglionic infarction (M4-M6 cortex): 3 Total score (0-10 with 10 being normal): 10 IMPRESSION: 1. No evidence of acute infarct or hemorrhage. 2. ASPECTS is 10. 3. 2 cm sellar and suprasellar mass versus aneurysm. CTA is pending. These results were called by telephone on 07/05/2017 at 10:30 am to Dr. Amada Jupiter, who verbally acknowledged these results. Electronically Signed   By: Sebastian Ache M.D.   On: 07/05/2017 10:50    12-lead ECG sinus rhythm (personally reviewed) All prior EKG's in EPIC reviewed with no documented atrial fibrillation  Telemetry sinus rhythm (personally reviewed)  Assessment and Plan:  1. Cryptogenic stroke/syncope The patient presents with cryptogenic stroke.  The patient has a TEE planned for later today.  I spoke at length with the patient about monitoring for afib with an implantable loop recorder.  Risks, benefits, and alteratives to implantable loop recorder were discussed with the patient today.   At this time, the patient is very  clear in their decision to proceed with implantable loop recorder.   Wound care was reviewed with the patient (keep incision clean and dry for 3 days).  Wound check scheduled and entered in AVS.  Please call with questions.   Gypsy Balsam, NP 07/10/2017 6:56 AM  EP Attending  Patient seen and examined. Agree with above. He has had a cryptogenic stroke and his TEE does not explain the etiology. I have discussed the indications for ILR insertion and he wishes to proceed.   Leonia Reeves.D.

## 2017-07-10 NOTE — Progress Notes (Signed)
OT Note Addendum - late entry    07/09/17 1645  OT Visit Information  Last OT Received On 07/09/17  OT Time Calculation  OT Start Time (ACUTE ONLY) 1621  OT Stop Time (ACUTE ONLY) 1641  OT Time Calculation (min) 20 min  OT General Charges  $OT Visit 1 Visit  [facility], OT/L  [phone] 07/10/2017

## 2017-07-10 NOTE — CV Procedure (Signed)
Brief TEE Note  LVEF 55-60% No LA/LAA thrombus or mass Mild TR, MR No ASD or PFO by color flow Doppler or saline microcavitation study.  For additional details see full report  During this procedure the patient is administered a total of Versed 4 mg and Fentanyl 50 mcg to achieve and maintain moderate conscious sedation.  The patient's heart rate, blood pressure, and oxygen saturation are monitored continuously during the procedure. The period of conscious sedation is 17 minutes, of which I was present face-to-face 100% of this time.   Audry Pecina C. Sangrey, MD, [facility] 07/10/2017  3:13 PM

## 2017-07-10 NOTE — Discharge Summary (Signed)
Name: Tyler Gomez MRN: [id number] DOB: Jul 15, 1956 61 y.o. PCP: Ellyn Hack, MD  DateRITHY MANDLEY10/12/2016 10:00 AM Date of Discharge: 07/10/2017 Attending Physician: Anne Shutter, MD  Discharge Diagnosis:  Principal Problem:   Syncope Active Problems:   CVA (cerebral vascular accident) [facility])   Stroke (cerebrum) [facility])  Discharge Medications: Allergies as of 07/10/2017   No Known Allergies     Medication List    STOP taking these medications   clopidogrel 75 MG tablet Commonly known as:  PLAVIX   losartan-hydrochlorothiazide 100-25 MG tablet Commonly known as:  HYZAAR     TAKE these medications   aspirin EC 81 MG tablet Take 1 tablet (81 mg total) by mouth daily. What changed:  medication strength  how much to take   atorvastatin 40 MG tablet Commonly known as:  LIPITOR Take 1 tablet (40 mg total) by mouth daily at 6 PM.   fluticasone 50 MCG/ACT nasal spray Commonly known as:  FLONASE Place 2 sprays into both nostrils daily. What changed:  when to take this  reasons to take this   hydrochlorothiazide 25 MG tablet Commonly known as:  HYDRODIURIL Take 25 mg by mouth daily.   losartan 100 MG tablet Commonly known as:  COZAAR Take 100 mg by mouth daily.   potassium chloride SA 20 MEQ tablet Commonly known as:  K-DUR,KLOR-CON Take 1 tablet (20 mEq total) by mouth 3 (three) times daily.       Disposition and follow-up:   Mr.Tyler Gomez was discharged from [facility] in Good condition.  At the hospital follow up visit please address:  1.  The patient had an acute right ACA stroke on 07/05/17 and represented with syncope and left lower extremity weakness on 07/08/17. The patient was hypotensive after his syncopal episode and a code stroke was called on admission. His workup did not reveal a new stroke, but rather his imaging was consistent with expansion of previous R ACA stroke territory.  He also had a small  subarachnoid hemorrhage that resulted from his syncopal event. The patient's LLE weakness resolved on admission and he remained without neurologic defects and headache throughout admission. Please assess LLE strength, headache, and any other neurologic complaints. Patient's antihypertensive medications were held during admission. Please assess for symptoms of hypotension.   2.  Labs / imaging needed at time of follow-up: BMP  3.  Pending labs/ test needing follow-up: 1) Patient has implantable loop monitor, will need to follow up results with cardiology and neurology as outpatient. 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 Patient will need outpatient neurosurgery consultation and follow up.  Follow-up Appointments: Follow-up Information    [facility] Follow up on 07/23/2017.   Specialty:  Cardiology Why:  at 9:30AM Contact information: [address] [place] [address] [phone]       Ellyn Hack, MD. Schedule an appointment as soon as possible for a visit in 1 week(s).   Specialty:  Family Medicine Why:  Please follow up wih your PCP. You have not been medically cleared to return to work until you have been seen and evaluated by your PCP.  Contact information: [address] [place] [address] [phone]        Micki Riley, MD. Schedule an appointment as soon as possible for a visit in 6 week(s).   Specialties:  Neurology, Radiology  Why:  Please call to make an appointment in the Stroke Clinic for follow up in 6 weeks. You were seen and manged by Dr. Pearlean Brownie while inpatient during both of your hopsitalizations. He will be able to discuss your stroke and neurosurgical referral with you.  Contact information: [address] [place] [address] [phone]           Hospital Course by problem list: Principal Problem:    Syncope Active Problems:   CVA (cerebral vascular accident) (HCC)   Stroke (cerebrum) [facility])  Mr Tyler Gomez is a 61 yo with a PMH of hypertension and recent right ACA stroke who presented after a syncopal episode and LLE weakness on 07/08/17. A code stroke was called in the ED and his head CT showed evidence of known ACA infarction without new ICA or MCA infarct. He was admitted to the internal medicine teaching service with neurology and cardiology consulting for management. The specific problems addressed during his admission were as follows:  LLE weakness and recent history of R ACA stroke: Patient previously presented with similar symptoms when his R ACA stroke was first diagnosed on 07/05/17. The patient's brain MRI showed small extension of previous infarct, which previously measured 1x1.5 cm but expanded to 2 x 2.5 on repeat imaging. His brain MRI did not reveal evidence of new stroke. There was also a small, underlying subarachnoid hemorrhage identified as a result of the patient's syncopal event which precipitated his trip to the ED. As a result of this SAH the patient's dual antiplatelet therapy was discontinued during his hospitalization and aspirin 81 mg daily was restarted on discharge. The patient had TEE which showed normal ejection fraction without evidence of LA thrombus that could explain original stroke. His EKG on admission showed sinus bradycardia, which was observed on numerous occasions during overnight telemetry throughout his hospitalization. The patient had no episodes of A-Fib while hospitalized, but there is concern that an underlying arrhthymia may have caused his first stroke on 9/30 and syncope on 10/3. Because of this the patient had an implantable loop device placed on 10/5 and the patient plans to follow up with cardiology and neurology as an outpatient for further evaluation.   Syncope: The patient's syncopal episode most likely resulted from hypotension 2/2 volume  depletion or possible underlying arrhythmia. This is most likely because the patient's BP on arrival was 60/40. His BP normalized with fluid resuscitation and discontinuation of his home anti-hypertensive medications. As discussed above the patient obtained implantable loop recorder with EP today and patient will follow up with neurology/cardiology as outpatient regarding results and possible anti-coagulation if A-Fib is identified.   Hx of Hypertension: Patient's BP has been within normal limits will restart home HCTZ 25 mg and losartan 100 mg daily upon discharge.   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 The patient's imaging has been stable and he will need to follow up with neurosurgery as an outpatient.  Patient was instructed to not return to work until cleared by a primary care physician. A letter was provided stating that he is not medically cleared for work until he is evaluated by PCP. The patient expressed understanding of this work restriction and plans to follow up with PCP within 1 week of discharge.  Discharge Vitals:   BP 128/70 (BP Location: Right Arm)   Pulse 62   Temp 98.5 F (36.9 C) (  Oral)   Resp 16   Ht 6' (1.829 m)   Wt 200 lb (90.7 kg)   SpO2 100%   BMI 27.12 kg/m   Pertinent Labs, Studies, and Procedures:   BMP BMP Latest Ref Rng & Units 07/09/2017 07/08/2017 07/08/2017  Glucose 65 - 99 mg/dL 99 782(N) 562(Z)  BUN 6 - 20 mg/dL Creatinine 0.61 - 1.24 mg/dL 3.08 6.57 8.46  BUN/Creat Ratio 10 - 22 - - -  Sodium 135 - 145 mmol/L 138 136 139  Potassium 3.5 - 5.1 mmol/L 3.5 3.4(L) 3.1(L)  Chloride 101 - 111 mmol/L 109 104 101  CO2 22 - 32 mmol/L 22 23 -  Calcium 8.9 - 10.3 mg/dL 9.6(E) 9.0 -   CBC    Component Value Date/Time   WBC 5.7 07/09/2017 0812   RBC 4.14 (L) 07/09/2017 0812   HGB 11.9 (L) 07/09/2017 0812   HGB  14.1 11/30/2013 1125   HCT 35.2 (L) 07/09/2017 0812   HCT 42.9 11/30/2013 1125   PLT 208 07/09/2017 0812   PLT 216 11/30/2013 1125   MCV 85.0 07/09/2017 0812   MCV 83 11/30/2013 1125   MCH 28.7 07/09/2017 0812   MCHC 33.8 07/09/2017 0812   RDW 13.2 07/09/2017 0812   RDW 14.0 11/30/2013 1125   LYMPHSABS 0.9 07/08/2017 1103   MONOABS 0.4 07/08/2017 1103   EOSABS 0.0 07/08/2017 1103   BASOSABS 0.0 07/08/2017 1103   APTT = 26 seconds PT(INR) = 14.3 seconds (1.12)  Brain MRI: IMPRESSION: Slight enlargement of the region of infarction in the right frontal lobe, now measuring about 2 x 2.5 cm as opposed to 1 x 1.5 cm on the previous study. No evidence of mass effect or parenchymal hemorrhage in this location.  Newly seen right frontal scalp swelling, presumably hematoma secondary to a fall.  Abnormal FLAIR and gradient signal in the occipital sulci right more than left. This could be proteinaceous material or blood secondary to the recent infarction or could represent a small amount of post traumatic subarachnoid hemorrhage if in fact the right frontal scalp finding does indicate recent fall.  Head CT: ASPECTS [facility] Stroke Program Early CT Score)  - Ganglionic level infarction (caudate, lentiform nuclei, internal capsule, insula, M1-M3 cortex): 7  - Supraganglionic infarction (M4-M6 cortex): 3  Total score (0-10 with 10 being normal): 10  IMPRESSION: 1. Expected evolutionary change of the previously identified ACA territory RIGHT frontal infarct. No hemorrhagic transformation or new areas of ICA/MCA infarction. 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 2. ASPECTS is 10.  Brief TEE Note LVEF 55-60% No LA/LAA thrombus or mass Mild TR, MR No ASD or PFO by color flow Doppler or saline microcavitation study.  For additional details see full report Tiffany C. Duke Salvia, MD, [facility] 07/10/2017  3:13 PM   Discharge Instructions: Discharge  Instructions    Call MD for:  difficulty breathing, headache or visual disturbances    Complete by:  As directed    Call MD for:  extreme fatigue    Complete by:  As directed    Call MD for:  persistant dizziness or light-headedness    Complete by:  As directed    Call MD for:  persistant nausea and vomiting    Complete by:  As directed    Call MD for:  temperature >100.4    Complete by:  As directed    Diet - low sodium heart healthy    Complete by:  As  directed    Discharge instructions    Complete by:  As directed    You were evaluated for left lower extremity weakness and a fainting spell. Your workup was reassuring that you did not suffer a new stroke, but rather you had an aggravation of your old stroke which occurred on 07/05/17. Your workup was reassuring that you do not have a functional problem with your heart, but we want to make sure that you do not have intermittent trouble with your heart's rhythm that caused your stroke and fainting spell. This is why you have an implantable device that will monitor your heart's rhythm over time. Please follow up with the cardiologists and neurologist regarding your recent stroke and implantation of your loop monitor. You will no longer take aspirin 325 mg and plavix 75 mg daily. Please discard this medication. You will now take aspirin 81 mg (baby aspirin) daily to help prevent future strokes. You will take aspirin, along with Lipitor, and your blood pressure medication daily.   Please follow up with your PCP regarding your medications and further questions regarding your hospitalization. Please follow up wih your regular doctor next week after leaving the hospital. You have not been medically cleared to return to work until you have been seen and evaluated by this physician.   Increase activity slowly    Complete by:  As directed       Signed: Rozann Lesches, MD 07/10/2017, 6:17 PM   Pager: [phone]

## 2017-07-10 NOTE — H&P (View-Only) (Signed)
  ELECTROPHYSIOLOGY CONSULT NOTE  Patient ID: Tyler Gomez MRN: [id number], DOB/AGE: 62/26/1957   Admit date: 07/08/2017 Date of Consult: 07/10/2017  Primary Physician: Shah, Syed Asad A, MD Primary Cardiologist: new to [facility] Reason for Consultation: Cryptogenic stroke; recommendations regarding Implantable Loop Recorder  History of Present Illness EP has been asked to evaluate [name] for placement of an implantable loop recorder to monitor for atrial fibrillation by Dr Sethi.  The patient was admitted on 07/08/2017 with syncope. He was recently discharged with ACA stroke and outpatient TEE/ILR was recommended.  His syncope occurred while standing at home. He had preceding dizziness and felt like he was going to pass out.  He has had syncope in the past dating back 10 years.  Some episodes occur while sitting. He has been evaluated and concern was for hypoglycemia.  EMS records mention bradycardia, however, I am unable to find documentation in run strips. He was hypotensive on their arrival and responded to fluid.   He has undergone workup for stroke including carotid imaging.  The patient has been monitored on telemetry which has demonstrated sinus rhythm with no arrhythmias.  Inpatient stroke work-up is to be completed with a TEE.   Echocardiogram pending.   Prior to admission, the patient denies chest pain, shortness of breath, dizziness, palpitations, or syncope.  They are recovering from their stroke with plans to return home at discharge.  Past Medical History:  Diagnosis Date  . Hypertension   . Stroke (cerebrum) (HCC)   . Syncope      Surgical History: History reviewed. No pertinent surgical history.   Prescriptions Prior to Admission  Medication Sig Dispense Refill Last Dose  . aspirin EC 325 MG EC tablet Take 1 tablet (325 mg total) by mouth daily. 30 tablet 0 07/08/2017 at Unknown time  . atorvastatin (LIPITOR) 40 MG tablet Take 1 tablet (40 mg total) by  mouth daily at 6 PM. 30 tablet 0 07/07/2017 at Unknown time  . clopidogrel (PLAVIX) 75 MG tablet Take 1 tablet (75 mg total) by mouth daily. 21 tablet 0 07/08/2017 at Unknown time  . hydrochlorothiazide (HYDRODIURIL) 25 MG tablet Take 25 mg by mouth daily.   07/08/2017 at Unknown time  . losartan (COZAAR) 100 MG tablet Take 100 mg by mouth daily.   07/08/2017 at Unknown time  . potassium chloride SA (K-DUR,KLOR-CON) 20 MEQ tablet Take 1 tablet (20 mEq total) by mouth 3 (three) times daily. 90 tablet 0 07/08/2017 at Unknown time  . fluticasone (FLONASE) 50 MCG/ACT nasal spray Place 2 sprays into both nostrils daily. (Patient taking differently: Place 2 sprays into both nostrils daily as needed for allergies. ) 16 g 1 prn  . losartan-hydrochlorothiazide (HYZAAR) 100-25 MG tablet Take 1 tablet by mouth daily. (Patient not taking: Reported on 07/08/2017) 90 tablet 1 Not Taking at Unknown time    Inpatient Medications:  . atorvastatin  40 mg Oral q1800    Allergies: No Known Allergies  Social History   Social History  . Marital status: Married    Spouse name: N/A  . Number of children: N/A  . Years of education: N/A   Occupational History  . Not on file.   Social History Main Topics  . Smoking status: Never Smoker  . Smokeless tobacco: Never Used  . Alcohol use No  . Drug use: No  . Sexual activity: Yes   Other Topics Concern  . Not on file   Social History Narrative  . No   narrative on file     Family History  Problem Relation Age of Onset  . Heart attack Father       Review of Systems: All other systems reviewed and are otherwise negative except as noted above.  Physical Exam: Vitals:   07/09/17 1742 07/09/17 2151 07/10/17 0139 07/10/17 0533  BP: 132/77 133/74 124/69 133/86  Pulse: (!) 57 62 60 61  Resp: 18 18 18 18  Temp: 98.4 F (36.9 C) 99 F (37.2 C) 98.9 F (37.2 C) 98.4 F (36.9 C)  TempSrc: Oral Oral Oral Oral  SpO2: 100% 100% 100% 100%  Weight:        Height:        GEN- The patient is well appearing, alert and oriented x 3 today.   Head- normocephalic, atraumatic Eyes-  Sclera clear, conjunctiva pink Ears- hearing intact Oropharynx- clear Neck- supple Lungs- Clear to ausculation bilaterally, normal work of breathing Heart- Regular rate and rhythm, no murmurs, rubs or gallops  GI- soft, NT, ND, + BS Extremities- no clubbing, cyanosis, or edema MS- no significant deformity or atrophy Skin- no rash or lesion Psych- euthymic mood, full affect   Labs:   Lab Results  Component Value Date   WBC 5.7 07/09/2017   HGB 11.9 (L) 07/09/2017   HCT 35.2 (L) 07/09/2017   MCV 85.0 07/09/2017   PLT 208 07/09/2017     Recent Labs Lab 07/08/17 1103 07/09/17 0812  NA 136 138  K 3.4* 3.5  CL 104 109  CO2 23 22  BUN 10 6  CREATININE 1.08 0.82  CALCIUM 9.0 8.8*  PROT 8.1  --   BILITOT 0.8  --   ALKPHOS 47  --   ALT 39  --   AST 43*  --   GLUCOSE 101* 99     Radiology/Studies: Ct Angio Head W Or Wo Contrast  Result Date: 07/05/2017 CLINICAL DATA:  Left-sided weakness. EXAM: CT ANGIOGRAPHY HEAD AND NECK TECHNIQUE: Multidetector CT imaging of the head and neck was performed using the standard protocol during bolus administration of intravenous contrast. Multiplanar CT image reconstructions and MIPs were obtained to evaluate the vascular anatomy. Carotid stenosis measurements (when applicable) are obtained utilizing NASCET criteria, using the distal internal carotid diameter as the denominator. CONTRAST:  50 mL Isovue 370 COMPARISON:  Noncontrast head CT earlier today FINDINGS: CTA NECK FINDINGS Aortic arch: Standard 3 vessel aortic arch with mild atherosclerotic plaque. Widely patent brachiocephalic and subclavian arteries. Right carotid system: Patent with mild scattered intimal thickening and luminal irregularity. No stenosis or dissection. Left carotid system: Patent with mild scattered intimal thickening and luminal irregularity.  Minimal calcified plaque in the proximal ICA. No stenosis or dissection. Vertebral arteries: Patent and codominant without evidence of stenosis or dissection. Mild non stenotic calcified plaque at the right vertebral artery origin. Skeleton: No acute osseous abnormality or suspicious osseous lesion. Other neck: No mass or lymph node enlargement. Upper chest: Clear lung apices. Review of the MIP images confirms the above findings CTA HEAD FINDINGS Anterior circulation: The internal carotid arteries are patent from skullbase to carotid termini with left greater than right siphon atherosclerosis. There is mild-to-moderate stenosis of the supraclinoid and cavernous segments on the left with minimal to mild narrowing on the right. The sellar and suprasellar mass extending over the right posterior clinoid process does not demonstrate arterial enhancement and does not reflect an aneurysm. The MCAs are patent without M1 stenosis. There is moderate MCA branch vessel irregularity and narrowing   bilaterally without evidence of proximal branch occlusion. The left A1 segment is not visualized and is likely congenitally absent. The right A1 segment is widely patent and supplies the left ACA. There is moderate ACA branch vessel irregularity and narrowing with asymmetric right A2 attenuation and missing right A3 branch vessels. Posterior circulation: The intracranial vertebral arteries are patent to the basilar with atherosclerotic irregularity bilaterally. There are mild right and severe left V4 segment stenoses, with evaluation mildly limited by beam hardening. The basilar artery is patent and diffusely small, presumably on a congenital basis, with superimposed irregularity and mild narrowing. Proximal SCAs are grossly patent. The PCAs are patent and diffusely small in caliber without evidence of flow limiting proximal stenosis. The mass along the posterior right clinoid process abuts the proximal right PCA without vascular  encasement. No aneurysm. Venous sinuses: Limited assessment by contrast timing. Anatomic variants: Absent left A1. Delayed phase: Not performed. Review of the MIP images confirms the above findings IMPRESSION: 1. No emergent large vessel occlusion. 2. Intracranial atherosclerosis with mild right and moderate left ICA stenoses and diffuse branch vessel irregularity. 3. Right A2 attenuation with missing right ACA branch vessels. 4. Diminutive posterior circulation with mild right and severe left V4 stenoses and mild basilar narrowing. 5. Posterior right clinoid and sellar mass which does not reflect an aneurysm. An MRI is pending for further evaluation. 6. Widely patent cervical carotid arteries. Preliminary findings were discussed via telephone with Dr. Kirkpatrick on 07/05/2017 at 10:40 a.m. Electronically Signed   By: Allen  Grady M.D.   On: 07/05/2017 11:21   Dg Chest 2 View  Result Date: 07/08/2017 CLINICAL DATA:  61-year-old male with shortness of breath and weakness. Recent small right ACA territory infarct. EXAM: CHEST  2 VIEW COMPARISON:  None. FINDINGS: Upper I AP and lateral views of the chest. Cardiac size is at the upper limits of normal to mildly enlarged. Other mediastinal contours are within normal limits. Visualized tracheal air column is within normal limits. The lungs are clear. No pneumothorax or pleural effusion. Negative visible bowel gas pattern. No acute osseous abnormality identified. IMPRESSION: Mild cardiomegaly.  No acute cardiopulmonary abnormality. Electronically Signed   By: H  Hall M.D.   On: 07/08/2017 11:47   Ct Head Wo Contrast  Result Date: 07/08/2017 CLINICAL DATA:  LEFT-sided weakness. Possible cardiac event, hypotension, recent stroke. EXAM: CT HEAD WITHOUT CONTRAST TECHNIQUE: Contiguous axial images were obtained from the base of the skull through the vertex without intravenous contrast. COMPARISON:  CT head, CTA head, and MR head 07/05/2017. FINDINGS: Brain: Evolving  cytotoxic edema, RIGHT anterior medial frontal lobe, correlating with the area of cortical infarction noted previous. Otherwise, no new areas of suspected cerebral infarction, hemorrhage, hydrocephalus, or extra-axial fluid. 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 Vascular: Calcification of the cavernous internal carotid arteries consistent with cerebrovascular atherosclerotic disease. No signs of intracranial large vessel occlusion. Skull: Normal. Negative for fracture or focal lesion. Sinuses/Orbits: No acute finding. Other: None. ASPECTS (Alberta Stroke Program Early CT Score) - Ganglionic level infarction (caudate, lentiform nuclei, internal capsule, insula, M1-M3 cortex): 7 - Supraganglionic infarction (M4-M6 cortex): 3 Total score (0-10 with 10 being normal): 10 IMPRESSION: 1. Expected evolutionary change of the previously identified ACA territory RIGHT frontal infarct. No hemorrhagic transformation or new areas of ICA/MCA infarction. 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 2. ASPECTS is 10. These results were called by telephone   at the time of interpretation on 07/08/2017 at 10:20 am to Dr. Arora, who verbally acknowledged these results. Electronically Signed   By: John T Curnes M.D.   On: 07/08/2017 10:36   Ct Angio Neck W Or Wo Contrast  Result Date: 07/05/2017 CLINICAL DATA:  Left-sided weakness. EXAM: CT ANGIOGRAPHY HEAD AND NECK TECHNIQUE: Multidetector CT imaging of the head and neck was performed using the standard protocol during bolus administration of intravenous contrast. Multiplanar CT image reconstructions and MIPs were obtained to evaluate the vascular anatomy. Carotid stenosis measurements (when applicable) are obtained utilizing NASCET criteria, using the distal internal carotid diameter as the denominator. CONTRAST:  50 mL Isovue 370 COMPARISON:   Noncontrast head CT earlier today FINDINGS: CTA NECK FINDINGS Aortic arch: Standard 3 vessel aortic arch with mild atherosclerotic plaque. Widely patent brachiocephalic and subclavian arteries. Right carotid system: Patent with mild scattered intimal thickening and luminal irregularity. No stenosis or dissection. Left carotid system: Patent with mild scattered intimal thickening and luminal irregularity. Minimal calcified plaque in the proximal ICA. No stenosis or dissection. Vertebral arteries: Patent and codominant without evidence of stenosis or dissection. Mild non stenotic calcified plaque at the right vertebral artery origin. Skeleton: No acute osseous abnormality or suspicious osseous lesion. Other neck: No mass or lymph node enlargement. Upper chest: Clear lung apices. Review of the MIP images confirms the above findings CTA HEAD FINDINGS Anterior circulation: The internal carotid arteries are patent from skullbase to carotid termini with left greater than right siphon atherosclerosis. There is mild-to-moderate stenosis of the supraclinoid and cavernous segments on the left with minimal to mild narrowing on the right. The sellar and suprasellar mass extending over the right posterior clinoid process does not demonstrate arterial enhancement and does not reflect an aneurysm. The MCAs are patent without M1 stenosis. There is moderate MCA branch vessel irregularity and narrowing bilaterally without evidence of proximal branch occlusion. The left A1 segment is not visualized and is likely congenitally absent. The right A1 segment is widely patent and supplies the left ACA. There is moderate ACA branch vessel irregularity and narrowing with asymmetric right A2 attenuation and missing right A3 branch vessels. Posterior circulation: The intracranial vertebral arteries are patent to the basilar with atherosclerotic irregularity bilaterally. There are mild right and severe left V4 segment stenoses, with evaluation  mildly limited by beam hardening. The basilar artery is patent and diffusely small, presumably on a congenital basis, with superimposed irregularity and mild narrowing. Proximal SCAs are grossly patent. The PCAs are patent and diffusely small in caliber without evidence of flow limiting proximal stenosis. The mass along the posterior right clinoid process abuts the proximal right PCA without vascular encasement. No aneurysm. Venous sinuses: Limited assessment by contrast timing. Anatomic variants: Absent left A1. Delayed phase: Not performed. Review of the MIP images confirms the above findings IMPRESSION: 1. No emergent large vessel occlusion. 2. Intracranial atherosclerosis with mild right and moderate left ICA stenoses and diffuse branch vessel irregularity. 3. Right A2 attenuation with missing right ACA branch vessels. 4. Diminutive posterior circulation with mild right and severe left V4 stenoses and mild basilar narrowing. 5. Posterior right clinoid and sellar mass which does not reflect an aneurysm. An MRI is pending for further evaluation. 6. Widely patent cervical carotid arteries. Preliminary findings were discussed via telephone with Dr. Kirkpatrick on 07/05/2017 at 10:40 a.m. Electronically Signed   By: Allen  Grady M.D.   On: 07/05/2017 11:21   Mr Brain Wo Contrast  Result Date: 07/09/2017 CLINICAL   DATA:  Left-sided weakness. Sellar mass. Right frontal infarct. EXAM: MRI HEAD WITHOUT CONTRAST TECHNIQUE: Multiplanar, multiecho pulse sequences of the brain and surrounding structures were obtained without intravenous contrast. COMPARISON:  07/05/2017 MRI.  07/08/2017 CT. FINDINGS: Brain: There has been slight extension of the medial right frontal infarction when compared to the MR study of 07/05/2017. Previously this measured about 1 x 1.5 cm. Today this measures about 2 x 2.5 cm. Mild swelling in the region of infarction but no evidence of hemorrhage or mass effect. No new area of infarction is  identified. Elsewhere, there is a small amount of proteinaceous signal in the sulci in the occipital regions right more than left, probably either some minimal blood products or protein related to the infarction. Also, there appears to be a right frontal scalp hematoma suggesting that the patient might have recently fallen with trauma to the head. Therefore, this could be a small amount of post traumatic subarachnoid hemorrhage. Punctate foci of susceptibility artifact in the left temporal lobe were present previously and are not likely significant, probably relating either to old head trauma or tiny developmental venous anomalies. No hydrocephalus. No extra-axial fluid collection. 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 Vascular: Major vessels at the base of the brain show flow. Skull and upper cervical spine: Right frontal scalp hematoma suggesting recent fall. No bone finding. Sinuses/Orbits: Clear/normal Other: None IMPRESSION: Slight enlargement of the region of infarction in the right frontal lobe, now measuring about 2 x 2.5 cm as opposed to 1 x 1.5 cm on the previous study. No evidence of mass effect or parenchymal hemorrhage in this location. Newly seen right frontal scalp swelling, presumably hematoma secondary to a fall. Abnormal FLAIR and gradient signal in the occipital sulci right more than left. This could be proteinaceous material or blood secondary to the recent infarction or could represent a small amount of post traumatic subarachnoid hemorrhage if in fact the right frontal scalp finding does indicate recent fall. Electronically Signed   By: Mark  Shogry M.D.   On: 07/09/2017 07:54   Mr Brain W Wo Contrast  Result Date: 07/05/2017 CLINICAL DATA:  Left-sided weakness which has improved. Sellar region mass on CT. EXAM: MRI HEAD WITHOUT AND WITH CONTRAST TECHNIQUE: Multiplanar, multiecho pulse sequences of the brain and surrounding structures were obtained without and  with intravenous contrast. CONTRAST:  10mL MULTIHANCE GADOBENATE DIMEGLUMINE 529 MG/ML IV SOLN COMPARISON:  Head CT and CTA earlier today FINDINGS: Brain: There is a 1 cm focus of cortical restricted diffusion involving the anterior aspect of the right cingulate gyrus. A small amount of restricted diffusion also involves the adjacent white matter along the right lateral margin of the anterior corpus callosum. A chronic microhemorrhage is noted in the posterior left temporal lobe. No significant chronic cerebral white matter disease is seen for age. The ventricles and sulci are normal in size for age. There is no midline shift or extra-axial fluid collection. Dedicated pituitary protocol imaging was performed. As seen on today's earlier CTs, there is a lobulated sellar mass which extends into the suprasellar cistern and posteriorly over the right posterior clinoid process. The mass measures 1.5 x 0.7 x 2.0 cm and is variably T1 hyperintense without appreciable enhancement. No susceptibility suggestive of blood products is identified in the posterior aspect of the mass, while the more anterior aspect is obscured on the SWI sequence. The mass mildly displaces the right aspect of the optic chiasm superiorly. The mass abuts but does not   encase the right supraclinoid ICA and proximal right PCA. A small amount of normal enhancing pituitary tissue is present along the floor of the sella. Vascular: Major intracranial vascular flow voids are preserved. Skull and upper cervical spine: No suspicious osseous lesion. Sinuses/Orbits: Unremarkable orbits. Paranasal sinuses and mastoid air cells are clear. Other: None. IMPRESSION: 1. Small acute right ACA infarct. 2. 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 Mild mass effect on the optic chiasm. Electronically Signed   By: Allen  Grady M.D.   On: 07/05/2017 13:57   Ct Head Code Stroke Wo Contrast  Result Date:  07/05/2017 CLINICAL DATA:  Code stroke.  Left-sided weakness, improving. EXAM: CT HEAD WITHOUT CONTRAST TECHNIQUE: Contiguous axial images were obtained from the base of the skull through the vertex without intravenous contrast. COMPARISON:  None. FINDINGS: Brain: There is no evidence of acute infarct, intracranial hemorrhage, midline shift, or extra-axial fluid collection. The ventricles and sulci are normal for age. There is a lobulated 2 cm hyperdense mass in the sella and suprasellar cistern asymmetric to the right along the posterior clinoid process. Vascular: Calcified atherosclerosis at the skullbase. No hyperdense vessel. Skull: No fracture or focal osseous lesion. Sinuses/Orbits: Mild right ethmoid air cell mucosal thickening. Clear mastoid air cells. Unremarkable orbits. Other: None. ASPECTS (Alberta Stroke Program Early CT Score) - Ganglionic level infarction (caudate, lentiform nuclei, internal capsule, insula, M1-M3 cortex): 7 - Supraganglionic infarction (M4-M6 cortex): 3 Total score (0-10 with 10 being normal): 10 IMPRESSION: 1. No evidence of acute infarct or hemorrhage. 2. ASPECTS is 10. 3. 2 cm sellar and suprasellar mass versus aneurysm. CTA is pending. These results were called by telephone on 07/05/2017 at 10:30 am to Dr. Kirkpatrick, who verbally acknowledged these results. Electronically Signed   By: Allen  Grady M.D.   On: 07/05/2017 10:50    12-lead ECG sinus rhythm (personally reviewed) All prior EKG's in EPIC reviewed with no documented atrial fibrillation  Telemetry sinus rhythm (personally reviewed)  Assessment and Plan:  1. Cryptogenic stroke/syncope The patient presents with cryptogenic stroke.  The patient has a TEE planned for later today.  I spoke at length with the patient about monitoring for afib with an implantable loop recorder.  Risks, benefits, and alteratives to implantable loop recorder were discussed with the patient today.   At this time, the patient is very  clear in their decision to proceed with implantable loop recorder.   Wound care was reviewed with the patient (keep incision clean and dry for 3 days).  Wound check scheduled and entered in AVS.  Please call with questions.   Amber Seiler, NP 07/10/2017 6:56 AM  EP Attending  Patient seen and examined. Agree with above. He has had a cryptogenic stroke and his TEE does not explain the etiology. I have discussed the indications for ILR insertion and he wishes to proceed.   Latrena Benegas,M.D. 

## 2017-07-10 NOTE — Progress Notes (Deleted)
  Echocardiogram 2D Echocardiogram has been performed.  Roosvelt Maser F 07/10/2017, 3:24 PM

## 2017-07-11 ENCOUNTER — Encounter (HOSPITAL_COMMUNITY): Payer: Self-pay | Admitting: Cardiovascular Disease

## 2017-07-13 ENCOUNTER — Encounter (HOSPITAL_COMMUNITY): Payer: Self-pay | Admitting: Internal Medicine

## 2017-07-14 ENCOUNTER — Encounter: Payer: Self-pay | Admitting: Family Medicine

## 2017-07-14 ENCOUNTER — Ambulatory Visit (INDEPENDENT_AMBULATORY_CARE_PROVIDER_SITE_OTHER): Payer: Managed Care, Other (non HMO) | Admitting: Family Medicine

## 2017-07-14 ENCOUNTER — Other Ambulatory Visit: Payer: Self-pay | Admitting: *Deleted

## 2017-07-14 VITALS — BP 142/90 | HR 76 | Ht 70.0 in | Wt 198.0 lb

## 2017-07-14 DIAGNOSIS — I63321 Cerebral infarction due to thrombosis of right anterior cerebral artery: Secondary | ICD-10-CM

## 2017-07-14 DIAGNOSIS — E236 Other disorders of pituitary gland: Secondary | ICD-10-CM

## 2017-07-14 DIAGNOSIS — I10 Essential (primary) hypertension: Secondary | ICD-10-CM

## 2017-07-14 MED ORDER — LOSARTAN POTASSIUM 50 MG PO TABS: 50.0000 mg | ORAL_TABLET | Freq: Every day | ORAL | 0 refills | Status: DC

## 2017-07-14 NOTE — Progress Notes (Signed)
Name: Tyler Gomez   MRN: [id number]    DOB: [birth year]   Date:07/14/2017       Progress Note  Subjective  Chief Complaint  Chief Complaint  Patient presents with  . Follow-up    Hospital follow up, was taken off all BPs meds during hospital admission     HPI  Patient presents for hospital follow-up, he had a right ACA stroke, presented to the hospital with left sided weakness, acute infarction in right ACA territory. Neurology was consulted, stroke was supposed to be from cardioembolic source, patient discharged on dual antiplatelet therapy,  clopidogrel and aspirin. Presented again on October 3 after an episode of left lower extremity weakness and syncope, was readmitted.MRI showed expansion of previous right ACA stroke and a small subarachnoid hemorrhage. It also showed a Rathke's cyst on the patient was advised to wear an implantable loop monitor and follow-up with cardiology, blood pressure was below normal and his medications were held on discharge.   since discharge from hospital, he feels well, no weakness, numbness, blood pressure is reasonably controlled although still elevated.  Past Medical History:  Diagnosis Date  . Hypertension   . Stroke (cerebrum) (HCC)   . Syncope     Past Surgical History:  Procedure Laterality Date  . LOOP RECORDER INSERTION N/A 07/10/2017   Procedure: LOOP RECORDER INSERTION;  Surgeon: Marinus Maw, MD;  Location: [facility] INVASIVE CV LAB;  Service: Cardiovascular;  Laterality: N/A;  . TEE WITHOUT CARDIOVERSION N/A 07/10/2017   Procedure: TRANSESOPHAGEAL ECHOCARDIOGRAM (TEE) WITH LOOP;  Surgeon: Chilton Si, MD;  Location: [facility] ENDOSCOPY;  Service: Cardiovascular;  Laterality: N/A;    Family History  Problem Relation Age of Onset  . Heart attack Father     Social History   Social History  . Marital status: Married    Spouse name: N/A  . Number of children: N/A  . Years of education: N/A   Occupational History  . Not on file.    Social History Main Topics  . Smoking status: Never Smoker  . Smokeless tobacco: Never Used  . Alcohol use No  . Drug use: No  . Sexual activity: Yes   Other Topics Concern  . Not on file   Social History Narrative  . No narrative on file     Current Outpatient Prescriptions:  .  aspirin EC 81 MG tablet, Take 1 tablet (81 mg total) by mouth daily., Disp: 90 tablet, Rfl: 3 .  atorvastatin (LIPITOR) 40 MG tablet, Take 1 tablet (40 mg total) by mouth daily at 6 PM., Disp: 30 tablet, Rfl: 0 .  fluticasone (FLONASE) 50 MCG/ACT nasal spray, Place 2 sprays into both nostrils daily. (Patient taking differently: Place 2 sprays into both nostrils daily as needed for allergies. ), Disp: 16 g, Rfl: 1 .  potassium chloride SA (K-DUR,KLOR-CON) 20 MEQ tablet, Take 1 tablet (20 mEq total) by mouth 3 (three) times daily., Disp: 90 tablet, Rfl: 0  No Known Allergies   ROS   the CHP after complete discussion of ROS  Objective  Vitals:   07/14/17 1126  BP: (!) 142/90  Pulse: 76  SpO2: 96%  Weight: 198 lb (89.8 kg)  Height:  (1.778 m)    Physical Exam  Constitutional: He is oriented to person, place, and time and well-developed, well-nourished, and in no distress.  HENT:  Head: Normocephalic and atraumatic.  Cardiovascular: Normal rate, regular rhythm and normal heart sounds.   No murmur heard. Pulmonary/Chest: Effort normal  and breath sounds normal. He has no wheezes.  Abdominal: Soft. Bowel sounds are normal. There is no tenderness.  Musculoskeletal: He exhibits no edema.  Neurological: He is alert and oriented to person, place, and time. He has normal strength and intact cranial nerves. Gait normal.  Psychiatric: Mood, memory, affect and judgment normal.  Nursing note and vitals reviewed.    Assessment & Plan  1. Cerebrovascular accident (CVA) due to thrombosis of right anterior cerebral artery [facility]) Reviewed hospital summaries from both admissions, patient has a  follow-up planned with neurology, continue on dual antiplatelet  2. Rathke's cyst [facility]) referral to neurosurgery for management, - Ambulatory referral to Neurosurgery  3. Essential hypertension We will start on lower dose of losartan at 50 mg, advised to check blood pressure regularly and follow-up for monitoring in one month  - losartan (COZAAR) 50 MG tablet; Take 1 tablet (50 mg total) by mouth daily.  Dispense: 90 tablet; Refill: 0   Orlean Holtrop Asad A. [facility] 07/14/2017 11:40 AM

## 2017-07-14 NOTE — Patient Outreach (Signed)
[facility]) Care Management  07/14/2017  Tyler Gomez [birth year] [id number]  EMMI-Stroke RED ON EMMI ALERT DAY#: 6 DATE: 07/13/17 RED ALERT: Questions/Problems with meds? Yes   Outreach attempt to patient. HIPAA verified with patient. Patient requested RN CM Dorann Lodge) to call back at a later time to speak with his spouse. Patient stated, his wife handles his medications. He stated, his spouse answered the questions to the [facility] automated questionnaire.   Plan: RN CM will contact patient within one week.    Wynelle Cleveland, RN, BSN, MHA/MSL, [facility] Telephonic Care Manager Coordinator [facility] Direct Phone: [phone] Toll Free: [phone] Fax: [phone]

## 2017-07-15 ENCOUNTER — Other Ambulatory Visit: Payer: Self-pay | Admitting: *Deleted

## 2017-07-15 ENCOUNTER — Ambulatory Visit: Payer: Self-pay | Admitting: *Deleted

## 2017-07-15 NOTE — Patient Outreach (Signed)
[facility]) Care Management  07/15/2017  GRACIN MCPARTLAND [birth year] [id number]   Zedric Deroy Lieb [birth year] [id number]  EMMI-Stroke RED ON EMMI ALERT DAY#: 6 DATE: 07/13/17 RED ALERT: Questions/Problems with meds? Yes   Outreach attempt #2 to patient.  No answer. RN CM left HIPAA compliant message along with contact info.   Plan: RN CM will contact patient within one week.    Wynelle Cleveland, RN, BSN, MHA/MSL, [facility] Telephonic Care Manager Coordinator [facility] Direct Phone: [phone] Toll Free: [phone] Fax: [phone]

## 2017-07-16 ENCOUNTER — Ambulatory Visit: Payer: Self-pay | Admitting: *Deleted

## 2017-07-16 ENCOUNTER — Encounter: Payer: Self-pay | Admitting: *Deleted

## 2017-07-16 ENCOUNTER — Other Ambulatory Visit: Payer: Self-pay | Admitting: *Deleted

## 2017-07-16 NOTE — Telephone Encounter (Signed)
This encounter was created in error - please disregard.

## 2017-07-16 NOTE — Patient Outreach (Signed)
[facility]) Care Management 07/15/17  Tyler Gomez 1956/07/13 [id number]  EMMI-Stroke RED ON EMMI ALERT DAY#: 6 DATE:07/13/17 RED ALERT:  Questions/problems with meds? Yes  Outreach attempt to patient. HIPAA verified with patient's spouse Tyler Gomez). Tyler Gomez confirmed patient had problems with his meds. Tyler Gomez explained, patient had two hospital admissions on 07/05/17 to 07/06/17 and 07/08/17 to 07/10/17. She said, patient was started on new medications during the first admission. The new medication was discontinued during his second hospital stay, per [facility]. Tyler Gomez verbalized receiving the hospital discharge paperwork, however there was some miscommunication. Patient was able to have his medications reconciled during his discharge follow-up appointment on 07/14/17, per spouse. Tyler Gomez stated, she manages patient's medications. Patient is taking his medication as prescribed, including Aspirin, Lipitor, and Losartan.   Plan: RN CM will notify [facility] Case Management Assistant regarding case closure.  RN CM will send patient EMMI educational materials. RM CM will send successful outreach letter to patient   Wynelle Cleveland, RN, BSN, MHA/MSL, [facility] Telephonic Care Manager Coordinator [facility] Direct Phone: [phone] Toll Free: [phone] Fax: [phone]

## 2017-07-23 ENCOUNTER — Ambulatory Visit (INDEPENDENT_AMBULATORY_CARE_PROVIDER_SITE_OTHER): Payer: Self-pay | Admitting: *Deleted

## 2017-07-23 DIAGNOSIS — I639 Cerebral infarction, unspecified: Secondary | ICD-10-CM

## 2017-07-23 LAB — CUP PACEART INCLINIC DEVICE CHECK
MDC IDC PG IMPLANT DT: 20181005
MDC IDC SESS DTM: 20181018104202

## 2017-07-23 NOTE — Progress Notes (Signed)
Wound check appointment. Steri-strips removed. Wound without redness or edema. Incision edges approximated, wound well healed. Battery status: Good. R-waves 0.96 mV. 0 symptom episodes, 0 tachy episodes, 0 pause episodes, 0 brady episodes. 0 AF episodes (0% burden). Monthly summary reports and ROV with GT PRN.

## 2017-08-01 ENCOUNTER — Other Ambulatory Visit: Payer: Self-pay | Admitting: Internal Medicine

## 2017-08-01 DIAGNOSIS — E785 Hyperlipidemia, unspecified: Secondary | ICD-10-CM

## 2017-08-09 ENCOUNTER — Other Ambulatory Visit: Payer: Self-pay | Admitting: Internal Medicine

## 2017-08-09 DIAGNOSIS — E785 Hyperlipidemia, unspecified: Secondary | ICD-10-CM

## 2017-08-10 ENCOUNTER — Ambulatory Visit (INDEPENDENT_AMBULATORY_CARE_PROVIDER_SITE_OTHER): Payer: Managed Care, Other (non HMO) | Admitting: *Deleted

## 2017-08-10 DIAGNOSIS — I63321 Cerebral infarction due to thrombosis of right anterior cerebral artery: Secondary | ICD-10-CM | POA: Diagnosis not present

## 2017-08-10 NOTE — Progress Notes (Signed)
Carelink Summary Report / Loop Recorder 

## 2017-08-11 LAB — CUP PACEART REMOTE DEVICE CHECK
Date Time Interrogation Session: 20181104211130
MDC IDC PG IMPLANT DT: 20181005

## 2017-08-14 ENCOUNTER — Ambulatory Visit: Payer: Managed Care, Other (non HMO) | Admitting: Family Medicine

## 2017-08-17 ENCOUNTER — Encounter: Payer: Self-pay | Admitting: Family Medicine

## 2017-08-17 ENCOUNTER — Ambulatory Visit (INDEPENDENT_AMBULATORY_CARE_PROVIDER_SITE_OTHER): Payer: Managed Care, Other (non HMO) | Admitting: Family Medicine

## 2017-08-17 VITALS — BP 142/94 | HR 88 | Temp 98.7°F | Resp 14 | Ht 70.0 in | Wt 204.3 lb

## 2017-08-17 DIAGNOSIS — T502X5A Adverse effect of carbonic-anhydrase inhibitors, benzothiadiazides and other diuretics, initial encounter: Secondary | ICD-10-CM | POA: Diagnosis not present

## 2017-08-17 DIAGNOSIS — E785 Hyperlipidemia, unspecified: Secondary | ICD-10-CM | POA: Diagnosis not present

## 2017-08-17 DIAGNOSIS — E876 Hypokalemia: Secondary | ICD-10-CM

## 2017-08-17 DIAGNOSIS — I10 Essential (primary) hypertension: Secondary | ICD-10-CM | POA: Diagnosis not present

## 2017-08-17 MED ORDER — LOSARTAN POTASSIUM-HCTZ 100-12.5 MG PO TABS: 1.0000 | ORAL_TABLET | Freq: Every day | ORAL | 0 refills | Status: DC

## 2017-08-17 MED ORDER — ATORVASTATIN CALCIUM 40 MG PO TABS: 40.0000 mg | ORAL_TABLET | Freq: Every day | ORAL | 0 refills | Status: DC

## 2017-08-17 MED ORDER — POTASSIUM CHLORIDE CRYS ER 20 MEQ PO TBCR: 20.0000 meq | EXTENDED_RELEASE_TABLET | Freq: Every day | ORAL | 0 refills | Status: DC

## 2017-08-17 NOTE — Progress Notes (Signed)
Name: Tyler MccreedyMyron F Gulyas   MRN: [id number]    DOB: 06/04/1956   Date:08/17/2017       Progress Note  Subjective  Chief Complaint  Chief Complaint  Patient presents with  . Hypertension    Follow up; BP has been still being high. Last reading was this morning 158 on top. Pt denies any blur vision or lightheadness  . Medication Refill    Lipitor and potassium     Hypertension  This is a chronic problem. The problem is unchanged. The problem is uncontrolled. Pertinent negatives include no blurred vision, chest pain, headaches, palpitations, shortness of breath or sweats. Past treatments include angiotensin blockers. There are no compliance problems.  Hypertensive end-organ damage includes CVA. There is no history of kidney disease or CAD/MI.  Hyperlipidemia  This is a chronic problem. The problem is uncontrolled. Recent lipid tests were reviewed and are high. Pertinent negatives include no chest pain, leg pain, myalgias or shortness of breath. Current antihyperlipidemic treatment includes statins. There are no compliance problems.    He is on Potassium Chloride 20 mEQ 3 times daily for hypokalemia, this was likely due to diuretic treatment for elevated blood Pressure, his last potassium level was 3.4 in hospital.    Past Medical History:  Diagnosis Date  . Hypertension   . Stroke (cerebrum) (HCC)   . Syncope     No past surgical history on file.  Family History  Problem Relation Age of Onset  . Heart attack Father     Social History   Socioeconomic History  . Marital status: Married    Spouse name: Not on file  . Number of children: Not on file  . Years of education: Not on file  . Highest education level: Not on file  Social Needs  . Financial resource strain: Not on file  . Food insecurity - worry: Not on file  . Food insecurity - inability: Not on file  . Transportation needs - medical: Not on file  . Transportation needs - non-medical: Not on file  Occupational  History  . Not on file  Tobacco Use  . Smoking status: Never Smoker  . Smokeless tobacco: Never Used  Substance and Sexual Activity  . Alcohol use: No  . Drug use: No  . Sexual activity: Yes  Other Topics Concern  . Not on file  Social History Narrative  . Not on file     Current Outpatient Medications:  .  aspirin EC 81 MG tablet, Take 1 tablet (81 mg total) by mouth daily., Disp: 90 tablet, Rfl: 3 .  atorvastatin (LIPITOR) 40 MG tablet, Take 1 tablet (40 mg total) by mouth daily at 6 PM., Disp: 30 tablet, Rfl: 0 .  losartan (COZAAR) 50 MG tablet, Take 1 tablet (50 mg total) by mouth daily., Disp: 90 tablet, Rfl: 0 .  potassium chloride SA (K-DUR,KLOR-CON) 20 MEQ tablet, Take 1 tablet (20 mEq total) by mouth 3 (three) times daily., Disp: 90 tablet, Rfl: 0 .  fluticasone (FLONASE) 50 MCG/ACT nasal spray, Place 2 sprays into both nostrils daily. (Patient not taking: Reported on 07/23/2017), Disp: 16 g, Rfl: 1  No Known Allergies   Review of Systems  Eyes: Negative for blurred vision.  Respiratory: Negative for shortness of breath.   Cardiovascular: Negative for chest pain and palpitations.  Musculoskeletal: Negative for myalgias.  Neurological: Negative for headaches.    Objective  Vitals:   08/17/17 1045 08/17/17 1051  BP: (!) 144/96 (!) 142/94  Pulse:  88   Resp: 14   Temp: 98.7 F (37.1 C)   TempSrc: Oral   SpO2: 99%   Weight: 204 lb 4.8 oz (92.7 kg)   Height: 5\' 10"  (1.778 m)     Physical Exam  Constitutional: He is oriented to person, place, and time and well-developed, well-nourished, and in no distress.  HENT:  Head: Normocephalic and atraumatic.  Cardiovascular: Normal rate, regular rhythm and normal heart sounds.  No murmur heard. Pulmonary/Chest: Effort normal and breath sounds normal. He has no wheezes.  Abdominal: Soft. Bowel sounds are normal. There is no tenderness.  Musculoskeletal: He exhibits no edema.  Neurological: He is alert and oriented to  person, place, and time.  Psychiatric: Mood, memory, affect and judgment normal.  Nursing note and vitals reviewed.    Assessment & Plan  1. Hyperlipidemia LDL goal <70 Obtain FLP, continue on statin, additional indication for secondary prevention - Lipid panel - atorvastatin (LIPITOR) 40 MG tablet; Take 1 tablet (40 mg total) daily at 6 PM by mouth.  Dispense: 30 tablet; Refill: 0  2. Essential hypertension Blood pressure is elevated and uncontrolled, add  HCTZ 12.5 mg, increase losartan to 100 mg, recheck blood pressure in 6 weeks - losartan-hydrochlorothiazide (HYZAAR) 100-12.5 MG tablet; Take 1 tablet daily by mouth.  Dispense: 90 tablet; Refill: 0  3. Diuretic-induced hypokalemia Obtain potassium levels, may need to decrease potassium chloride to once daily - COMPLETE METABOLIC PANEL WITH GFR  Faisal Stradling Asad A. Faylene KurtzShah [facility] 08/17/2017 11:02 AM

## 2017-09-08 ENCOUNTER — Ambulatory Visit (INDEPENDENT_AMBULATORY_CARE_PROVIDER_SITE_OTHER): Payer: Managed Care, Other (non HMO) | Admitting: *Deleted

## 2017-09-08 DIAGNOSIS — I63321 Cerebral infarction due to thrombosis of right anterior cerebral artery: Secondary | ICD-10-CM

## 2017-09-09 NOTE — Progress Notes (Signed)
Carelink Summary Report / Loop Recorder 

## 2017-09-13 ENCOUNTER — Other Ambulatory Visit: Payer: Self-pay | Admitting: Family Medicine

## 2017-09-13 DIAGNOSIS — E785 Hyperlipidemia, unspecified: Secondary | ICD-10-CM

## 2017-09-18 LAB — CUP PACEART REMOTE DEVICE CHECK
Date Time Interrogation Session: 20181204214021
MDC IDC PG IMPLANT DT: 20181005

## 2017-09-30 ENCOUNTER — Ambulatory Visit: Payer: Managed Care, Other (non HMO) | Admitting: Family Medicine

## 2017-09-30 ENCOUNTER — Encounter: Payer: Self-pay | Admitting: Family Medicine

## 2017-09-30 VITALS — BP 146/80 | HR 100 | Temp 98.0°F | Resp 16 | Ht 70.0 in | Wt 206.3 lb

## 2017-09-30 DIAGNOSIS — Z23 Encounter for immunization: Secondary | ICD-10-CM | POA: Diagnosis not present

## 2017-09-30 DIAGNOSIS — I10 Essential (primary) hypertension: Secondary | ICD-10-CM | POA: Diagnosis not present

## 2017-09-30 DIAGNOSIS — Z8673 Personal history of transient ischemic attack (TIA), and cerebral infarction without residual deficits: Secondary | ICD-10-CM

## 2017-09-30 LAB — LIPID PANEL
CHOLESTEROL: 102 mg/dL (ref <200)
HDL: 31 mg/dL — AB (ref >40)
LDL CHOLESTEROL (CALC): 54 mg/dL
Non-HDL Cholesterol (Calc): 71 mg/dL (calc) (ref <130)
TRIGLYCERIDES: 87 mg/dL (ref <150)
Total CHOL/HDL Ratio: 3.3 (calc) (ref <5.0)

## 2017-09-30 MED ORDER — LOSARTAN POTASSIUM-HCTZ 100-25 MG PO TABS: 1.0000 | ORAL_TABLET | Freq: Every day | ORAL | 0 refills | Status: DC

## 2017-09-30 MED ORDER — LOSARTAN POTASSIUM-HCTZ 100-12.5 MG PO TABS: 1.0000 | ORAL_TABLET | Freq: Every day | ORAL | 0 refills | Status: DC

## 2017-09-30 NOTE — Progress Notes (Signed)
Name: Tyler MccreedyMyron F Senner   MRN: [id number]    DOB: 01/09/1956   Date:09/30/2017       Progress Note  Subjective  Chief Complaint  Chief Complaint  Patient presents with  . Follow-up    6 week F/U  . Hypertension    Doing well with medication, checks BP occasionally at home. Denies any symptoms    Hypertension  This is a chronic problem. The problem has been gradually worsening since onset. The problem is uncontrolled. Pertinent negatives include no blurred vision, chest pain, headaches, palpitations or shortness of breath. Risk factors for coronary artery disease include diabetes mellitus. Past treatments include angiotensin blockers and diuretics. Hypertensive end-organ damage includes CVA. There is no history of kidney disease or CAD/MI.  Hyperlipidemia  This is a chronic problem. The problem is controlled. Recent lipid tests were reviewed and are normal (below normal HDL.). Pertinent negatives include no chest pain, leg pain, myalgias or shortness of breath. Current antihyperlipidemic treatment includes statins.      Past Medical History:  Diagnosis Date  . 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 Hypertension   . Stroke (cerebrum) (HCC)   . Syncope     Past Surgical History:  Procedure Laterality Date  . LOOP RECORDER INSERTION N/A 07/10/2017   Procedure: LOOP RECORDER INSERTION;  Surgeon: Marinus Mawaylor, Gregg W, MD;  [facility];  Service: Cardiovascular;  Laterality: N/A;  . TEE WITHOUT CARDIOVERSION N/A 07/10/2017   Procedure: TRANSESOPHAGEAL ECHOCARDIOGRAM (TEE) WITH LOOP;  Surgeon: Chilton Siandolph, Jasmain Ahlberg, MD;  [facility];  Service: Cardiovascular;  Laterality: N/A;    Family History  Problem Relation Age of Onset  . Heart attack Father     Social History   Socioeconomic History  . Marital status: Married    Spouse name: Not on file  . Number of children: Not on file  . Years of education: Not on file  . Highest education level: Not on  file  Social Needs  . Financial resource strain: Not on file  . Food insecurity - worry: Not on file  . Food insecurity - inability: Not on file  . Transportation needs - medical: Not on file  . Transportation needs - non-medical: Not on file  Occupational History  . Not on file  Tobacco Use  . Smoking status: Never Smoker  . Smokeless tobacco: Never Used  Substance and Sexual Activity  . Alcohol use: No  . Drug use: No  . Sexual activity: Yes  Other Topics Concern  . Not on file  Social History Narrative  . Not on file     Current Outpatient Medications:  .  aspirin EC 81 MG tablet, Take 1 tablet (81 mg total) by mouth daily., Disp: 90 tablet, Rfl: 3 .  atorvastatin (LIPITOR) 40 MG tablet, TAKE 1 TABLET(40 MG) BY MOUTH DAILY AT 6 PM, Disp: 90 tablet, Rfl: 0 .  fluticasone (FLONASE) 50 MCG/ACT nasal spray, Place 2 sprays into both nostrils daily., Disp: 16 g, Rfl: 1 .  losartan-hydrochlorothiazide (HYZAAR) 100-12.5 MG tablet, Take 1 tablet by mouth daily., Disp: 90 tablet, Rfl: 0 .  potassium chloride SA (K-DUR,KLOR-CON) 20 MEQ tablet, Take 1 tablet (20 mEq total) daily by mouth., Disp: 90 tablet, Rfl: 0  No Known Allergies   Review of Systems  Eyes: Negative for blurred vision.  Respiratory: Negative for shortness of breath.   Cardiovascular: Negative for chest pain and palpitations.  Musculoskeletal: Negative for myalgias.  Neurological: Negative for headaches.  Objective  Vitals:   09/30/17 0838  BP: (!) 146/80  Pulse: 100  Resp: 16  Temp: 98 F (36.7 C)  TempSrc: Oral  SpO2: 97%  Weight: 206 lb 4.8 oz (93.6 kg)  Height: 5\' 10"  (1.778 m)    Physical Exam  Constitutional: He is oriented to person, place, and time and well-developed, well-nourished, and in no distress.  HENT:  Head: Normocephalic and atraumatic.  Mouth/Throat: Oropharynx is clear and moist.  Cardiovascular: Normal rate, regular rhythm and normal heart sounds.  No murmur  heard. Pulmonary/Chest: Effort normal and breath sounds normal. He has no wheezes.  Musculoskeletal: He exhibits no edema.  Neurological: He is alert and oriented to person, place, and time.  Psychiatric: Mood, memory, affect and judgment normal.  Nursing note and vitals reviewed.     Assessment & Plan  1. Need for immunization against influenza  - Flu Vaccine QUAD 6+ mos PF IM (Fluarix Quad PF)  2. Essential hypertension Pressure marginally elevated, will increase HCTZ to 25 mg daily, compliant with dietary and lifestyle changes. - losartan-hydrochlorothiazide (HYZAAR) 100-25 MG tablet; Take 1 tablet by mouth daily.  Dispense: 90 tablet; Refill: 0  3. History of ischemic right ACA stroke Patient follows up with neurology next month, is currently on disability but would like to go back to work once cleared, obtain FLP and continue on baby aspirin - Lipid panel  Syed Asad A. Faylene KurtzShah [facility] 09/30/2017 9:12 AM

## 2017-10-08 ENCOUNTER — Ambulatory Visit (INDEPENDENT_AMBULATORY_CARE_PROVIDER_SITE_OTHER): Payer: Managed Care, Other (non HMO) | Admitting: *Deleted

## 2017-10-08 DIAGNOSIS — I63321 Cerebral infarction due to thrombosis of right anterior cerebral artery: Secondary | ICD-10-CM

## 2017-10-09 NOTE — Progress Notes (Signed)
Carelink Summary Report / Loop Recorder 

## 2017-10-21 LAB — CUP PACEART REMOTE DEVICE CHECK
Date Time Interrogation Session: 20190103214000
MDC IDC PG IMPLANT DT: 20181005

## 2017-11-09 ENCOUNTER — Ambulatory Visit (INDEPENDENT_AMBULATORY_CARE_PROVIDER_SITE_OTHER): Payer: Self-pay | Admitting: *Deleted

## 2017-11-09 DIAGNOSIS — I63321 Cerebral infarction due to thrombosis of right anterior cerebral artery: Secondary | ICD-10-CM

## 2017-11-10 NOTE — Progress Notes (Signed)
Carelink Summary Report / Loop Recorder 

## 2017-11-12 ENCOUNTER — Other Ambulatory Visit: Payer: Self-pay | Admitting: Family Medicine

## 2017-11-17 LAB — CUP PACEART REMOTE DEVICE CHECK
Implantable Pulse Generator Implant Date: 20181005
MDC IDC SESS DTM: 20190202224046

## 2017-12-10 ENCOUNTER — Ambulatory Visit (INDEPENDENT_AMBULATORY_CARE_PROVIDER_SITE_OTHER): Payer: Self-pay | Admitting: *Deleted

## 2017-12-10 DIAGNOSIS — I63321 Cerebral infarction due to thrombosis of right anterior cerebral artery: Secondary | ICD-10-CM

## 2017-12-11 NOTE — Progress Notes (Signed)
Carelink Summary Report / Loop Recorder 

## 2017-12-14 ENCOUNTER — Other Ambulatory Visit: Payer: Self-pay

## 2017-12-14 DIAGNOSIS — E785 Hyperlipidemia, unspecified: Secondary | ICD-10-CM

## 2017-12-14 MED ORDER — ATORVASTATIN CALCIUM 40 MG PO TABS: ORAL_TABLET | ORAL | 0 refills | Status: DC

## 2017-12-14 NOTE — Telephone Encounter (Signed)
Last sgpt reviewed; last LDL less than 70; Rx approved

## 2017-12-14 NOTE — Telephone Encounter (Signed)
Pt has upcoming appt w/ you on 12/29/17

## 2017-12-23 ENCOUNTER — Telehealth: Payer: Self-pay | Admitting: Cardiology

## 2017-12-23 NOTE — Telephone Encounter (Signed)
Spoke w/ pt wife and requested that he send a manual transmission b/c his home monitor has not updated in at least 14 days.   

## 2017-12-28 ENCOUNTER — Other Ambulatory Visit: Payer: Self-pay

## 2017-12-28 DIAGNOSIS — I10 Essential (primary) hypertension: Secondary | ICD-10-CM

## 2017-12-29 ENCOUNTER — Ambulatory Visit: Payer: Managed Care, Other (non HMO) | Admitting: Family Medicine

## 2017-12-29 ENCOUNTER — Encounter: Payer: Self-pay | Admitting: Family Medicine

## 2017-12-29 ENCOUNTER — Other Ambulatory Visit: Payer: Self-pay

## 2017-12-29 DIAGNOSIS — T502X5A Adverse effect of carbonic-anhydrase inhibitors, benzothiadiazides and other diuretics, initial encounter: Secondary | ICD-10-CM

## 2017-12-29 DIAGNOSIS — Z8673 Personal history of transient ischemic attack (TIA), and cerebral infarction without residual deficits: Secondary | ICD-10-CM | POA: Diagnosis not present

## 2017-12-29 DIAGNOSIS — I10 Essential (primary) hypertension: Secondary | ICD-10-CM | POA: Diagnosis not present

## 2017-12-29 DIAGNOSIS — E785 Hyperlipidemia, unspecified: Secondary | ICD-10-CM

## 2017-12-29 DIAGNOSIS — E876 Hypokalemia: Secondary | ICD-10-CM | POA: Diagnosis not present

## 2017-12-29 DIAGNOSIS — R0981 Nasal congestion: Secondary | ICD-10-CM | POA: Diagnosis not present

## 2017-12-29 DIAGNOSIS — Z23 Encounter for immunization: Secondary | ICD-10-CM | POA: Insufficient documentation

## 2017-12-29 MED ORDER — LOSARTAN POTASSIUM-HCTZ 100-25 MG PO TABS: 1.0000 | ORAL_TABLET | Freq: Every day | ORAL | 1 refills | Status: DC

## 2017-12-29 MED ORDER — ATORVASTATIN CALCIUM 40 MG PO TABS: ORAL_TABLET | ORAL | 1 refills | Status: AC

## 2017-12-29 MED ORDER — POTASSIUM CHLORIDE CRYS ER 20 MEQ PO TBCR: EXTENDED_RELEASE_TABLET | ORAL | 1 refills | Status: DC

## 2017-12-29 MED ORDER — FLUTICASONE PROPIONATE 50 MCG/ACT NA SUSP: 2.0000 | Freq: Every day | NASAL | 1 refills | Status: DC

## 2017-12-29 NOTE — Progress Notes (Signed)
BP 102/70   Pulse 85   Temp 97.7 F (36.5 C) (Oral)   Resp 16   Ht 6' (1.829 m)   Wt 200 lb 1.6 oz (90.8 kg)   SpO2 96%   BMI 27.14 kg/m    Subjective:    Patient ID: Tyler Gomez, male    DOB: Jul 04, 1956, 62 y.o.   MRN: [id number]  HPI: Tyler Gomez is a 62 y.o. male  Chief Complaint  Patient presents with  . Follow-up    BP  . Medication Refill    Losartan Hyzaar, needs a 90 day supply    HPI Patient is new to me; previous provider left our practice Hypertension; well-controlled on current combo medicine, needs refills today BP at home is 120s over 70s; little low here but just came off the treadmill and took medicine without eating or drinking Low HDL, last labs in December showed HDL of 31; LDL was 54 Hx of stroke; he was at church, side felt like it was pulling, then passed out; church called ambulance, then at hospital, did scan and found he had a stroke; he is out of work; this was all six months ago; left side continues to be weak with lifting and prolonged standing, trying to recuperate; neurologist is Dr. Sherryll Burger at [facility] No bleeding on the aspirin; no gum bleeding, no nosedbleeds, no blood in the urine or stool; no s/s of stroke Trying to do activity on the treadmill; he thinks his stroke was stress and not eating right I asked if CVA runs in the family; his sister died in 16; she was real healthy, but had an aneurysm; bad headache, then went to [facility], coma and died Allergic rhinitis; nasal corticosteroid  MR brain w and w/o contrast 07/05/17: IMPRESSION: 1. Small acute right ACA infarct. 2. 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 Mild mass effect on the optic chiasm.  Electronically Signed   By: Sebastian Ache M.D.   On: 07/05/2017 13:57  He went back to work, then fell because BP was low and hit his head again, extension, head trauma they told hiim Reviewed notes from Dr. Sherryll Burger, MRI  from 07/09/17 Brain MR w/o contrast 07/09/17: IMPRESSION: Slight enlargement of the region of infarction in the right frontal lobe, now measuring about 2 x 2.5 cm as opposed to 1 x 1.5 cm on the previous study. No evidence of mass effect or parenchymal hemorrhage in this location.  Newly seen right frontal scalp swelling, presumably hematoma secondary to a fall.  Abnormal FLAIR and gradient signal in the occipital sulci right more than left. This could be proteinaceous material or blood secondary to the recent infarction or could represent a small amount of post traumatic subarachnoid hemorrhage if in fact the right frontal scalp finding does indicate recent fall.   Electronically Signed   By: Paulina Fusi M.D.   On: 07/09/2017 07:54  He had to give up working after so many years; went every day and now has a new grandbaby 3 months old and wants to stay away Hx of chronically low potassium; now on potassium supplementation and last K+ was 3.5 (normal)  Depression screen [facility] 2/9 12/29/2017 08/17/2017 09/16/2016 02/25/2016 08/28/2015  Decreased Interest 0 0 0 0 0  Down, Depressed, Hopeless 0 0 0 0 0  PHQ - 2 Score 0 0 0 0 0    Relevant past medical, surgical, family and social history reviewed Past Medical  History:  Diagnosis Date  . 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 Hypertension   . Stroke (cerebrum) (HCC)   . Syncope    Past Surgical History:  Procedure Laterality Date  . LOOP RECORDER INSERTION N/A 07/10/2017   Procedure: LOOP RECORDER INSERTION;  Surgeon: Marinus Mawaylor, Gregg W, MD;  [facility];  Service: Cardiovascular;  Laterality: N/A;  . TEE WITHOUT CARDIOVERSION N/A 07/10/2017   Procedure: TRANSESOPHAGEAL ECHOCARDIOGRAM (TEE) WITH LOOP;  Surgeon: Chilton Siandolph, Tiffany, MD;  [facility];  Service: Cardiovascular;  Laterality: N/A;   Family History  Problem Relation Age of Onset  . Heart attack Father    Social History   Tobacco  Use  . Smoking status: Never Smoker  . Smokeless tobacco: Never Used  Substance Use Topics  . Alcohol use: No  . Drug use: No    Interim medical history since last visit reviewed. Allergies and medications reviewed  Review of Systems Per HPI unless specifically indicated above     Objective:    BP 102/70   Pulse 85   Temp 97.7 F (36.5 C) (Oral)   Resp 16   Ht 6' (1.829 m)   Wt 200 lb 1.6 oz (90.8 kg)   SpO2 96%   BMI 27.14 kg/m   Wt Readings from Last 3 Encounters:  12/29/17 200 lb 1.6 oz (90.8 kg)  09/30/17 206 lb 4.8 oz (93.6 kg)  08/17/17 204 lb 4.8 oz (92.7 kg)    Physical Exam  Constitutional: He appears well-developed and well-nourished. No distress.  HENT:  Head: Normocephalic and atraumatic.  Eyes: EOM are normal. No scleral icterus.  Neck: No thyromegaly present.  Cardiovascular: Normal rate and regular rhythm.  Pulmonary/Chest: Effort normal and breath sounds normal.  Abdominal: Soft. Bowel sounds are normal. He exhibits no distension.  Musculoskeletal: He exhibits no edema.  Neurological: Coordination normal.  4+/5 on the LEFT  Skin: Skin is warm and dry. No pallor.  Psychiatric: He has a normal mood and affect. His behavior is normal. Judgment and thought content normal.    Results for orders placed or performed in visit on 11/09/17  CUP PACEART REMOTE DEVICE CHECK  Result Value Ref Range   Date Time Interrogation Session [id number]    Pulse Generator Manufacturer MERM    Pulse Gen Model [id number] Reveal LINQ    Pulse Gen Serial Number [id number]    Cl[facility]    Implantable Pulse Generator Type ICM/ILR    Implantable Pulse Generator Implant Date [id number]    Eval Rhythm SR       Assessment & Plan:   Problem List Items Addressed This Visit      Cardiovascular and Mediastinum   History of ischemic right ACA stroke    Glad he is doing well, exercising, seeing neurologist, tolerating aspirin and statin      Relevant  Medications   losartan-hydrochlorothiazide (HYZAAR) 100-25 MG tablet   atorvastatin (LIPITOR) 40 MG tablet   BP (high blood pressure)    Patient reports good pressures at home; continue same regimen; if pressure is low, we'll decrease the HCTZ      Relevant Medications   losartan-hydrochlorothiazide (HYZAAR) 100-25 MG tablet   atorvastatin (LIPITOR) 40 MG tablet     Respiratory   Nasal sinus congestion   Relevant Medications   fluticasone (FLONASE) 50 MCG/ACT nasal spray     Other   Dyslipidemia    Next lipids due in late June; avoid saturated fats  if possible      Relevant Medications   atorvastatin (LIPITOR) 40 MG tablet   Diuretic-induced hypokalemia    Continue current regimen; potassium normal on last check; if BP med needs adjustment, will drop the thiazide and recheck K+       Other Visit Diagnoses    Hyperlipidemia LDL goal <70       Relevant Medications   losartan-hydrochlorothiazide (HYZAAR) 100-25 MG tablet   atorvastatin (LIPITOR) 40 MG tablet       Follow up plan: Return in about 3 months (around 04/01/2018) for fasting labs; see Dr. Sherie Don 3-5 days later.  An after-visit summary was printed and given to the patient at check-out.  Please see the patient instructions which may contain other information and recommendations beyond what is mentioned above in the assessment and plan.  Meds ordered this encounter  Medications  . fluticasone (FLONASE) 50 MCG/ACT nasal spray    Sig: Place 2 sprays into both nostrils daily.    Dispense:  48 g    Refill:  1  . losartan-hydrochlorothiazide (HYZAAR) 100-25 MG tablet    Sig: Take 1 tablet by mouth daily.    Dispense:  90 tablet    Refill:  1  . potassium chloride SA (K-DUR,KLOR-CON) 20 MEQ tablet    Sig: TAKE 1 TABLET(20 MEQ) BY MOUTH DAILY    Dispense:  90 tablet    Refill:  1  . atorvastatin (LIPITOR) 40 MG tablet    Sig: Take one by mouth at bedtime for cholesterol    Dispense:  90 tablet    Refill:  1     No orders of the defined types were placed in this encounter.

## 2017-12-29 NOTE — Assessment & Plan Note (Signed)
Glad he is doing well, exercising, seeing neurologist, tolerating aspirin and statin

## 2017-12-29 NOTE — Telephone Encounter (Signed)
Which med?

## 2017-12-29 NOTE — Assessment & Plan Note (Signed)
Patient reports good pressures at home; continue same regimen; if pressure is low, we'll decrease the HCTZ

## 2017-12-29 NOTE — Patient Instructions (Signed)
Try to follow the DASH guidelines (DASH stands for Dietary Approaches to Stop Hypertension). Try to limit the sodium in your diet to no more than 1,500mg of sodium per day. Certainly try to not exceed 2,000 mg per day at the very most. Do not add salt when cooking or at the table.  Check the sodium amount on labels when shopping, and choose items lower in sodium when given a choice. Avoid or limit foods that already contain a lot of sodium. Eat a diet rich in fruits and vegetables and whole grains, and try to lose weight if overweight or obese  

## 2017-12-29 NOTE — Assessment & Plan Note (Signed)
Next lipids due in late June; avoid saturated fats if possible

## 2017-12-29 NOTE — Assessment & Plan Note (Signed)
Continue current regimen; potassium normal on last check; if BP med needs adjustment, will drop the thiazide and recheck K+

## 2018-01-12 ENCOUNTER — Ambulatory Visit (INDEPENDENT_AMBULATORY_CARE_PROVIDER_SITE_OTHER): Payer: Self-pay | Admitting: *Deleted

## 2018-01-12 DIAGNOSIS — I63321 Cerebral infarction due to thrombosis of right anterior cerebral artery: Secondary | ICD-10-CM

## 2018-01-13 NOTE — Progress Notes (Signed)
Carelink Summary Report / Loop Recorder 

## 2018-01-21 LAB — CUP PACEART REMOTE DEVICE CHECK
Implantable Pulse Generator Implant Date: 20181005
MDC IDC SESS DTM: 20190307231026

## 2018-02-11 ENCOUNTER — Telehealth: Payer: Self-pay | Admitting: Cardiology

## 2018-02-11 NOTE — Telephone Encounter (Signed)
LMOVM requesting that pt send manual transmission b/c home monitor has not updated in at least 14 days.    

## 2018-02-15 ENCOUNTER — Ambulatory Visit (INDEPENDENT_AMBULATORY_CARE_PROVIDER_SITE_OTHER): Payer: Self-pay | Admitting: *Deleted

## 2018-02-15 DIAGNOSIS — I63321 Cerebral infarction due to thrombosis of right anterior cerebral artery: Secondary | ICD-10-CM

## 2018-02-15 LAB — CUP PACEART REMOTE DEVICE CHECK
Implantable Pulse Generator Implant Date: 20181005
MDC IDC SESS DTM: 20190409233956

## 2018-02-15 NOTE — Progress Notes (Signed)
Carelink Summary Report / Loop Recorder 

## 2018-03-10 LAB — CUP PACEART REMOTE DEVICE CHECK
Implantable Pulse Generator Implant Date: 20181005
MDC IDC SESS DTM: 20190513001033

## 2018-03-19 ENCOUNTER — Ambulatory Visit (INDEPENDENT_AMBULATORY_CARE_PROVIDER_SITE_OTHER): Payer: Managed Care, Other (non HMO) | Admitting: *Deleted

## 2018-03-19 DIAGNOSIS — I63321 Cerebral infarction due to thrombosis of right anterior cerebral artery: Secondary | ICD-10-CM | POA: Diagnosis not present

## 2018-03-22 NOTE — Progress Notes (Signed)
Carelink Summary Report / Loop Recorder 

## 2018-03-31 ENCOUNTER — Encounter: Payer: Self-pay | Admitting: Family Medicine

## 2018-04-02 ENCOUNTER — Other Ambulatory Visit: Payer: Self-pay

## 2018-04-02 DIAGNOSIS — I63321 Cerebral infarction due to thrombosis of right anterior cerebral artery: Secondary | ICD-10-CM

## 2018-04-07 ENCOUNTER — Ambulatory Visit: Payer: Managed Care, Other (non HMO) | Admitting: Family Medicine

## 2018-04-07 ENCOUNTER — Telehealth: Payer: Self-pay | Admitting: Cardiology

## 2018-04-07 NOTE — Telephone Encounter (Signed)
LMOVM requesting that pt send manual transmission b/c home monitor has not updated in at least 14 days.    

## 2018-04-21 ENCOUNTER — Ambulatory Visit (INDEPENDENT_AMBULATORY_CARE_PROVIDER_SITE_OTHER): Payer: Managed Care, Other (non HMO) | Admitting: *Deleted

## 2018-04-21 DIAGNOSIS — I63321 Cerebral infarction due to thrombosis of right anterior cerebral artery: Secondary | ICD-10-CM | POA: Diagnosis not present

## 2018-04-22 NOTE — Progress Notes (Signed)
Carelink Summary Report / Loop Recorder 

## 2018-04-23 LAB — CUP PACEART REMOTE DEVICE CHECK
Date Time Interrogation Session: 20190615000738
Implantable Pulse Generator Implant Date: 20181005

## 2018-05-06 ENCOUNTER — Telehealth: Payer: Self-pay

## 2018-05-06 NOTE — Telephone Encounter (Signed)
LMOVM reminding pt to send remote transmission.   

## 2018-05-18 ENCOUNTER — Telehealth: Payer: Self-pay | Admitting: Cardiology

## 2018-05-18 NOTE — Telephone Encounter (Signed)
Spoke w/ pt wife and requested that he send a manual transmission b/c his home monitor has not updated in at least 14 days.   

## 2018-05-24 ENCOUNTER — Ambulatory Visit (INDEPENDENT_AMBULATORY_CARE_PROVIDER_SITE_OTHER): Payer: Managed Care, Other (non HMO) | Admitting: *Deleted

## 2018-05-24 DIAGNOSIS — I63321 Cerebral infarction due to thrombosis of right anterior cerebral artery: Secondary | ICD-10-CM

## 2018-05-25 NOTE — Progress Notes (Signed)
Carelink Summary Report / Loop Recorder 

## 2018-06-08 ENCOUNTER — Telehealth: Payer: Self-pay | Admitting: Cardiology

## 2018-06-08 LAB — CUP PACEART REMOTE DEVICE CHECK
Implantable Pulse Generator Implant Date: 20181005
MDC IDC SESS DTM: 20190718003536

## 2018-06-08 NOTE — Telephone Encounter (Signed)
Spoke w/ pt wife and requested that pt send a manual transmission b/c his home monitor has not updated in at least 14 days.   

## 2018-06-28 ENCOUNTER — Ambulatory Visit (INDEPENDENT_AMBULATORY_CARE_PROVIDER_SITE_OTHER): Payer: Managed Care, Other (non HMO) | Admitting: *Deleted

## 2018-06-28 DIAGNOSIS — I63321 Cerebral infarction due to thrombosis of right anterior cerebral artery: Secondary | ICD-10-CM | POA: Diagnosis not present

## 2018-06-28 LAB — CUP PACEART REMOTE DEVICE CHECK
MDC IDC PG IMPLANT DT: 20181005
MDC IDC SESS DTM: 20190820013523

## 2018-06-28 NOTE — Progress Notes (Signed)
Carelink Summary Report / Loop Recorder 

## 2018-07-05 LAB — CUP PACEART REMOTE DEVICE CHECK
Date Time Interrogation Session: 20190922014134
MDC IDC PG IMPLANT DT: 20181005

## 2018-07-29 ENCOUNTER — Ambulatory Visit (INDEPENDENT_AMBULATORY_CARE_PROVIDER_SITE_OTHER): Payer: Managed Care, Other (non HMO) | Admitting: *Deleted

## 2018-07-29 DIAGNOSIS — I63321 Cerebral infarction due to thrombosis of right anterior cerebral artery: Secondary | ICD-10-CM | POA: Diagnosis not present

## 2018-07-30 NOTE — Progress Notes (Signed)
Carelink Summary Report / Loop Recorder 

## 2018-08-13 LAB — CUP PACEART REMOTE DEVICE CHECK
Date Time Interrogation Session: 20191025023855
MDC IDC PG IMPLANT DT: 20181005

## 2018-08-31 ENCOUNTER — Ambulatory Visit (INDEPENDENT_AMBULATORY_CARE_PROVIDER_SITE_OTHER): Payer: Managed Care, Other (non HMO)

## 2018-08-31 DIAGNOSIS — I639 Cerebral infarction, unspecified: Secondary | ICD-10-CM | POA: Diagnosis not present

## 2018-09-01 NOTE — Progress Notes (Signed)
Carelink Summary Report / Loop Recorder 

## 2018-09-08 ENCOUNTER — Telehealth: Payer: Self-pay

## 2018-09-08 NOTE — Telephone Encounter (Signed)
Spoke w/ pt and requested that he send a manual transmission b/c his home monitor has not updated in at least 14 days.  I talked with pt wife about this matter.

## 2018-10-04 ENCOUNTER — Ambulatory Visit (INDEPENDENT_AMBULATORY_CARE_PROVIDER_SITE_OTHER): Payer: Managed Care, Other (non HMO)

## 2018-10-04 DIAGNOSIS — I63321 Cerebral infarction due to thrombosis of right anterior cerebral artery: Secondary | ICD-10-CM

## 2018-10-04 NOTE — Progress Notes (Signed)
Carelink Summary Report / Loop Recorder 

## 2018-10-05 LAB — CUP PACEART REMOTE DEVICE CHECK
Date Time Interrogation Session: 20191230030526
MDC IDC PG IMPLANT DT: 20181005

## 2018-10-17 LAB — CUP PACEART REMOTE DEVICE CHECK
Date Time Interrogation Session: 20191127024106
MDC IDC PG IMPLANT DT: 20181005

## 2018-11-05 ENCOUNTER — Ambulatory Visit (INDEPENDENT_AMBULATORY_CARE_PROVIDER_SITE_OTHER): Payer: Self-pay

## 2018-11-05 DIAGNOSIS — I63321 Cerebral infarction due to thrombosis of right anterior cerebral artery: Secondary | ICD-10-CM

## 2018-11-10 LAB — CUP PACEART REMOTE DEVICE CHECK
Implantable Pulse Generator Implant Date: 20181005
MDC IDC SESS DTM: 20200201092053

## 2018-11-12 NOTE — Progress Notes (Signed)
Carelink Summary Report / Loop Recorder 

## 2018-12-08 ENCOUNTER — Ambulatory Visit (INDEPENDENT_AMBULATORY_CARE_PROVIDER_SITE_OTHER): Payer: Self-pay | Admitting: *Deleted

## 2018-12-08 DIAGNOSIS — I63321 Cerebral infarction due to thrombosis of right anterior cerebral artery: Secondary | ICD-10-CM

## 2018-12-09 ENCOUNTER — Telehealth: Payer: Self-pay

## 2018-12-09 LAB — CUP PACEART REMOTE DEVICE CHECK
Date Time Interrogation Session: 20200305104159
MDC IDC PG IMPLANT DT: 20181005

## 2018-12-15 NOTE — Progress Notes (Signed)
Carelink Summary Report / Loop Recorder 

## 2019-01-11 ENCOUNTER — Other Ambulatory Visit: Payer: Self-pay

## 2019-01-11 ENCOUNTER — Ambulatory Visit (INDEPENDENT_AMBULATORY_CARE_PROVIDER_SITE_OTHER): Payer: Self-pay | Admitting: *Deleted

## 2019-01-11 DIAGNOSIS — I63321 Cerebral infarction due to thrombosis of right anterior cerebral artery: Secondary | ICD-10-CM

## 2019-01-11 LAB — CUP PACEART REMOTE DEVICE CHECK
Date Time Interrogation Session: 20200407100137
Implantable Pulse Generator Implant Date: 20181005

## 2019-01-19 NOTE — Progress Notes (Signed)
Carelink Summary Report / Loop Recorder 

## 2019-01-21 ENCOUNTER — Telehealth: Payer: Self-pay

## 2019-01-21 NOTE — Telephone Encounter (Signed)
Spoke with patient to remind of missed remote transmission 

## 2019-02-14 ENCOUNTER — Other Ambulatory Visit: Payer: Self-pay

## 2019-02-14 ENCOUNTER — Ambulatory Visit (INDEPENDENT_AMBULATORY_CARE_PROVIDER_SITE_OTHER): Payer: Managed Care, Other (non HMO) | Admitting: *Deleted

## 2019-02-14 DIAGNOSIS — I63321 Cerebral infarction due to thrombosis of right anterior cerebral artery: Secondary | ICD-10-CM

## 2019-02-14 LAB — CUP PACEART REMOTE DEVICE CHECK
Date Time Interrogation Session: 20200510124100
Implantable Pulse Generator Implant Date: 20181005

## 2019-02-21 NOTE — Progress Notes (Signed)
Carelink Summary Report / Loop Recorder 

## 2019-03-18 ENCOUNTER — Ambulatory Visit (INDEPENDENT_AMBULATORY_CARE_PROVIDER_SITE_OTHER): Payer: Non-veteran care | Admitting: *Deleted

## 2019-03-18 DIAGNOSIS — I63321 Cerebral infarction due to thrombosis of right anterior cerebral artery: Secondary | ICD-10-CM | POA: Diagnosis not present

## 2019-03-18 LAB — CUP PACEART REMOTE DEVICE CHECK
Date Time Interrogation Session: 20200612133814
Implantable Pulse Generator Implant Date: 20181005

## 2019-03-21 NOTE — Progress Notes (Signed)
Carelink Summary Report / Loop Recorder 

## 2019-04-14 ENCOUNTER — Other Ambulatory Visit: Payer: Self-pay | Admitting: Family Medicine

## 2019-04-14 DIAGNOSIS — Z20822 Contact with and (suspected) exposure to covid-19: Secondary | ICD-10-CM

## 2019-04-19 LAB — NOVEL CORONAVIRUS, NAA: SARS-CoV-2, NAA: NOT DETECTED

## 2019-04-20 ENCOUNTER — Ambulatory Visit (INDEPENDENT_AMBULATORY_CARE_PROVIDER_SITE_OTHER): Payer: Managed Care, Other (non HMO) | Admitting: *Deleted

## 2019-04-20 DIAGNOSIS — I63321 Cerebral infarction due to thrombosis of right anterior cerebral artery: Secondary | ICD-10-CM

## 2019-04-20 LAB — CUP PACEART REMOTE DEVICE CHECK
Date Time Interrogation Session: 20200715144210
Implantable Pulse Generator Implant Date: 20181005

## 2019-04-21 ENCOUNTER — Telehealth: Payer: Self-pay | Admitting: Hematology

## 2019-04-21 NOTE — Telephone Encounter (Signed)
Pt is calling and he is aware of covid 19 negative results

## 2019-04-29 NOTE — Progress Notes (Signed)
Carelink Summary Report / Loop Recorder 

## 2019-05-09 ENCOUNTER — Inpatient Hospital Stay
Admission: EM | Admit: 2019-05-09 | Discharge: 2019-05-11 | DRG: 640 | Disposition: A | Discharge status: Home / Self Care | Payer: No Typology Code available for payment source | Attending: Internal Medicine | Admitting: Internal Medicine

## 2019-05-09 ENCOUNTER — Inpatient Hospital Stay (HOSPITAL_COMMUNITY): Payer: No Typology Code available for payment source

## 2019-05-09 ENCOUNTER — Encounter: Payer: Self-pay | Admitting: Emergency Medicine

## 2019-05-09 ENCOUNTER — Inpatient Hospital Stay: Payer: No Typology Code available for payment source

## 2019-05-09 ENCOUNTER — Inpatient Hospital Stay (HOSPITAL_COMMUNITY)
Admit: 2019-05-09 | Discharge: 2019-05-09 | Disposition: A | Discharge status: Home / Self Care | Payer: No Typology Code available for payment source | Attending: Nurse Practitioner | Admitting: Nurse Practitioner

## 2019-05-09 ENCOUNTER — Other Ambulatory Visit: Payer: Self-pay

## 2019-05-09 DIAGNOSIS — Z20828 Contact with and (suspected) exposure to other viral communicable diseases: Secondary | ICD-10-CM | POA: Diagnosis present

## 2019-05-09 DIAGNOSIS — I10 Essential (primary) hypertension: Secondary | ICD-10-CM | POA: Diagnosis present

## 2019-05-09 DIAGNOSIS — I951 Orthostatic hypotension: Secondary | ICD-10-CM | POA: Diagnosis present

## 2019-05-09 DIAGNOSIS — R55 Syncope and collapse: Secondary | ICD-10-CM

## 2019-05-09 DIAGNOSIS — I371 Nonrheumatic pulmonary valve insufficiency: Secondary | ICD-10-CM | POA: Diagnosis not present

## 2019-05-09 DIAGNOSIS — Z8673 Personal history of transient ischemic attack (TIA), and cerebral infarction without residual deficits: Secondary | ICD-10-CM | POA: Diagnosis not present

## 2019-05-09 DIAGNOSIS — Z79899 Other long term (current) drug therapy: Secondary | ICD-10-CM

## 2019-05-09 DIAGNOSIS — J189 Pneumonia, unspecified organism: Secondary | ICD-10-CM | POA: Diagnosis present

## 2019-05-09 DIAGNOSIS — E860 Dehydration: Secondary | ICD-10-CM | POA: Diagnosis present

## 2019-05-09 DIAGNOSIS — R7989 Other specified abnormal findings of blood chemistry: Secondary | ICD-10-CM | POA: Diagnosis not present

## 2019-05-09 DIAGNOSIS — N390 Urinary tract infection, site not specified: Secondary | ICD-10-CM | POA: Diagnosis present

## 2019-05-09 DIAGNOSIS — J181 Lobar pneumonia, unspecified organism: Secondary | ICD-10-CM | POA: Diagnosis not present

## 2019-05-09 DIAGNOSIS — R9431 Abnormal electrocardiogram [ECG] [EKG]: Secondary | ICD-10-CM

## 2019-05-09 DIAGNOSIS — Z8249 Family history of ischemic heart disease and other diseases of the circulatory system: Secondary | ICD-10-CM

## 2019-05-09 DIAGNOSIS — E785 Hyperlipidemia, unspecified: Secondary | ICD-10-CM | POA: Diagnosis present

## 2019-05-09 DIAGNOSIS — I248 Other forms of acute ischemic heart disease: Secondary | ICD-10-CM | POA: Diagnosis present

## 2019-05-09 DIAGNOSIS — E876 Hypokalemia: Secondary | ICD-10-CM | POA: Diagnosis present

## 2019-05-09 DIAGNOSIS — R079 Chest pain, unspecified: Secondary | ICD-10-CM | POA: Diagnosis present

## 2019-05-09 DIAGNOSIS — I214 Non-ST elevation (NSTEMI) myocardial infarction: Secondary | ICD-10-CM

## 2019-05-09 DIAGNOSIS — R778 Other specified abnormalities of plasma proteins: Secondary | ICD-10-CM

## 2019-05-09 DIAGNOSIS — R509 Fever, unspecified: Secondary | ICD-10-CM

## 2019-05-09 LAB — URINALYSIS, COMPLETE (UACMP) WITH MICROSCOPIC
Bilirubin Urine: NEGATIVE
Glucose, UA: NEGATIVE mg/dL
Hgb urine dipstick: NEGATIVE
Ketones, ur: NEGATIVE mg/dL
Nitrite: NEGATIVE
Protein, ur: NEGATIVE mg/dL
Specific Gravity, Urine: 1.005 (ref 1.005–1.030)
WBC, UA: >50 WBC/hpf — ABNORMAL HIGH (ref 0–5)
pH: 6 (ref 5.0–8.0)

## 2019-05-09 LAB — CBC WITH DIFFERENTIAL/PLATELET
Abs Immature Granulocytes: 0.05 10*3/uL (ref 0.00–0.07)
Basophils Absolute: 0 10*3/uL (ref 0.0–0.1)
Basophils Relative: 0 %
Eosinophils Absolute: 0 10*3/uL (ref 0.0–0.5)
Eosinophils Relative: 0 %
HCT: 41.4 % (ref 39.0–52.0)
Hemoglobin: 13.6 g/dL (ref 13.0–17.0)
Immature Granulocytes: 0 %
Lymphocytes Relative: 8 %
Lymphs Abs: 0.9 10*3/uL (ref 0.7–4.0)
MCH: 28.5 pg (ref 26.0–34.0)
MCHC: 32.9 g/dL (ref 30.0–36.0)
MCV: 86.6 fL (ref 80.0–100.0)
Monocytes Absolute: 0.9 10*3/uL (ref 0.1–1.0)
Monocytes Relative: 8 %
Neutro Abs: 9.4 10*3/uL — ABNORMAL HIGH (ref 1.7–7.7)
Neutrophils Relative %: 84 %
Platelets: 289 10*3/uL (ref 150–400)
RBC: 4.78 MIL/uL (ref 4.22–5.81)
RDW: 11.9 % (ref 11.5–15.5)
WBC: 11.3 10*3/uL — ABNORMAL HIGH (ref 4.0–10.5)
nRBC: 0 % (ref 0.0–0.2)

## 2019-05-09 LAB — COMPREHENSIVE METABOLIC PANEL
ALT: 36 U/L (ref 0–44)
AST: 51 U/L — ABNORMAL HIGH (ref 15–41)
Albumin: 3.9 g/dL (ref 3.5–5.0)
Alkaline Phosphatase: 47 U/L (ref 38–126)
Anion gap: 13 (ref 5–15)
BUN: 15 mg/dL (ref 8–23)
CO2: 24 mmol/L (ref 22–32)
Calcium: 8.9 mg/dL (ref 8.9–10.3)
Chloride: 96 mmol/L — ABNORMAL LOW (ref 98–111)
Creatinine, Ser: 1.13 mg/dL (ref 0.61–1.24)
GFR calc Af Amer: >60 mL/min (ref >60)
GFR calc non Af Amer: >60 mL/min (ref >60)
Glucose, Bld: 118 mg/dL — ABNORMAL HIGH (ref 70–99)
Potassium: 2.9 mmol/L — ABNORMAL LOW (ref 3.5–5.1)
Sodium: 133 mmol/L — ABNORMAL LOW (ref 135–145)
Total Bilirubin: 1.4 mg/dL — ABNORMAL HIGH (ref 0.3–1.2)
Total Protein: 9.4 g/dL — ABNORMAL HIGH (ref 6.5–8.1)

## 2019-05-09 LAB — LIPID PANEL
Cholesterol: 89 mg/dL (ref 0–200)
HDL: 23 mg/dL — ABNORMAL LOW (ref >40)
LDL Cholesterol: 51 mg/dL (ref 0–99)
Total CHOL/HDL Ratio: 3.9 RATIO
Triglycerides: 73 mg/dL (ref <150)
VLDL: 15 mg/dL (ref 0–40)

## 2019-05-09 LAB — ECHOCARDIOGRAM COMPLETE
Height: 72 in
Weight: 3198.4 oz

## 2019-05-09 LAB — CK: Total CK: 216 U/L (ref 49–397)

## 2019-05-09 LAB — HEPARIN LEVEL (UNFRACTIONATED)
Heparin Unfractionated: 0.31 IU/mL (ref 0.30–0.70)
Heparin Unfractionated: 0.44 IU/mL (ref 0.30–0.70)

## 2019-05-09 LAB — POTASSIUM: Potassium: 3.2 mmol/L — ABNORMAL LOW (ref 3.5–5.1)

## 2019-05-09 LAB — SARS CORONAVIRUS 2 BY RT PCR (HOSPITAL ORDER, PERFORMED IN ~~LOC~~ HOSPITAL LAB): SARS Coronavirus 2: NEGATIVE

## 2019-05-09 LAB — MAGNESIUM: Magnesium: 2.3 mg/dL (ref 1.7–2.4)

## 2019-05-09 LAB — APTT: aPTT: 28 seconds (ref 24–36)

## 2019-05-09 LAB — PROTIME-INR
INR: 1.2 (ref 0.8–1.2)
Prothrombin Time: 15.2 seconds (ref 11.4–15.2)

## 2019-05-09 LAB — TROPONIN I (HIGH SENSITIVITY)
Troponin I (High Sensitivity): 463 ng/L (ref <18)
Troponin I (High Sensitivity): 849 ng/L (ref <18)
Troponin I (High Sensitivity): 796 ng/L (ref <18)

## 2019-05-09 MED ORDER — SODIUM CHLORIDE 0.9% FLUSH
3.0000 mL | Freq: Two times a day (BID) | INTRAVENOUS | Status: DC
  Administered 2019-05-09 – 2019-05-10 (×2): 3 mL via INTRAVENOUS

## 2019-05-09 MED ORDER — LOSARTAN POTASSIUM 50 MG PO TABS
100.0000 mg | ORAL_TABLET | Freq: Every day | ORAL | Status: DC
  Administered 2019-05-09: 100 mg via ORAL
  Filled 2019-05-09 (×2): qty 2

## 2019-05-09 MED ORDER — ALPRAZOLAM 0.5 MG PO TABS
0.5000 mg | ORAL_TABLET | ORAL | Status: AC
  Administered 2019-05-09: 0.5 mg via ORAL
  Filled 2019-05-09: qty 1

## 2019-05-09 MED ORDER — HEPARIN (PORCINE) 25000 UT/250ML-% IV SOLN
1200.0000 [IU]/h | INTRAVENOUS | Status: DC
  Administered 2019-05-09 (×2): 1200 [IU]/h via INTRAVENOUS
  Filled 2019-05-09 (×2): qty 250

## 2019-05-09 MED ORDER — ASPIRIN EC 81 MG PO TBEC
81.0000 mg | DELAYED_RELEASE_TABLET | Freq: Every day | ORAL | Status: DC
  Administered 2019-05-09 – 2019-05-11 (×3): 81 mg via ORAL
  Filled 2019-05-09 (×4): qty 1

## 2019-05-09 MED ORDER — HEPARIN BOLUS VIA INFUSION
4000.0000 [IU] | Freq: Once | INTRAVENOUS | Status: AC
  Administered 2019-05-09: 4000 [IU] via INTRAVENOUS
  Filled 2019-05-09: qty 4000

## 2019-05-09 MED ORDER — ACETAMINOPHEN 325 MG PO TABS
650.0000 mg | ORAL_TABLET | ORAL | Status: DC | PRN
  Administered 2019-05-10: 650 mg via ORAL
  Filled 2019-05-09 (×2): qty 2

## 2019-05-09 MED ORDER — LOSARTAN POTASSIUM-HCTZ 100-25 MG PO TABS: 1.0000 | ORAL_TABLET | Freq: Every day | ORAL | Status: DC

## 2019-05-09 MED ORDER — ONDANSETRON HCL 4 MG/2ML IJ SOLN: 4.0000 mg | Freq: Four times a day (QID) | INTRAMUSCULAR | Status: DC | PRN

## 2019-05-09 MED ORDER — TECHNETIUM TC 99M TETROFOSMIN IV KIT
30.0000 | PACK | Freq: Once | INTRAVENOUS | Status: AC | PRN
  Administered 2019-05-09: 32.251 via INTRAVENOUS

## 2019-05-09 MED ORDER — ATORVASTATIN CALCIUM 20 MG PO TABS
40.0000 mg | ORAL_TABLET | Freq: Every day | ORAL | Status: DC
  Administered 2019-05-09 – 2019-05-10 (×3): 40 mg via ORAL
  Filled 2019-05-09 (×3): qty 2

## 2019-05-09 MED ORDER — POTASSIUM CHLORIDE CRYS ER 20 MEQ PO TBCR
20.0000 meq | EXTENDED_RELEASE_TABLET | Freq: Every day | ORAL | Status: DC
  Administered 2019-05-10 – 2019-05-11 (×2): 20 meq via ORAL
  Filled 2019-05-09 (×3): qty 1

## 2019-05-09 MED ORDER — TECHNETIUM TC 99M TETROFOSMIN IV KIT
10.0000 | PACK | Freq: Once | INTRAVENOUS | Status: AC | PRN
  Administered 2019-05-09: 9.4 via INTRAVENOUS

## 2019-05-09 MED ORDER — SODIUM CHLORIDE 0.9% FLUSH: 3.0000 mL | INTRAVENOUS | Status: DC | PRN

## 2019-05-09 MED ORDER — SODIUM CHLORIDE 0.9 % IV BOLUS
1000.0000 mL | Freq: Once | INTRAVENOUS | Status: AC
  Administered 2019-05-09: 1000 mL via INTRAVENOUS

## 2019-05-09 MED ORDER — POTASSIUM CHLORIDE IN NACL 20-0.9 MEQ/L-% IV SOLN
INTRAVENOUS | Status: DC
  Administered 2019-05-09 – 2019-05-10 (×2): via INTRAVENOUS
  Filled 2019-05-09 (×4): qty 1000

## 2019-05-09 MED ORDER — REGADENOSON 0.4 MG/5ML IV SOLN
0.4000 mg | Freq: Once | INTRAVENOUS | Status: AC
  Administered 2019-05-09: 0.4 mg via INTRAVENOUS

## 2019-05-09 MED ORDER — POTASSIUM CHLORIDE CRYS ER 20 MEQ PO TBCR
40.0000 meq | EXTENDED_RELEASE_TABLET | Freq: Once | ORAL | Status: AC
  Administered 2019-05-09: 40 meq via ORAL
  Filled 2019-05-09: qty 2

## 2019-05-09 MED ORDER — PANTOPRAZOLE SODIUM 40 MG IV SOLR
40.0000 mg | INTRAVENOUS | Status: DC
  Administered 2019-05-09 – 2019-05-10 (×2): 40 mg via INTRAVENOUS
  Filled 2019-05-09 (×2): qty 40

## 2019-05-09 MED ORDER — SODIUM CHLORIDE 0.9 % IV SOLN
1.0000 g | Freq: Every day | INTRAVENOUS | Status: DC
  Administered 2019-05-09: 1 g via INTRAVENOUS
  Filled 2019-05-09: qty 1
  Filled 2019-05-09: qty 10

## 2019-05-09 MED ORDER — HYDROCHLOROTHIAZIDE 25 MG PO TABS: 25.0000 mg | ORAL_TABLET | Freq: Every day | ORAL | Status: DC

## 2019-05-09 MED ORDER — SODIUM CHLORIDE 0.9 % IV SOLN
INTRAVENOUS | Status: DC | PRN
  Administered 2019-05-09: 500 mL via INTRAVENOUS

## 2019-05-09 NOTE — ED Notes (Signed)
3 attempts at IV access, one by this RN and 2 by Quillian Quince, RN unsuccessful; pt tolerated well;

## 2019-05-09 NOTE — ED Notes (Signed)
Pt's wife has arrived; Dr Alfred Levins at bedside to speak with her

## 2019-05-09 NOTE — ED Notes (Signed)
Dr. Veronese at bedside 

## 2019-05-09 NOTE — Progress Notes (Signed)
ANTICOAGULATION CONSULT NOTE - Initial Consult  Pharmacy Consult for heparin Indication: chest pain/ACS  No Known Allergies  Patient Measurements: Height: 6' (182.9 cm) Weight: 216 lb 4.3 oz (98.1 kg) IBW/kg (Calculated) : 77.6 Heparin Dosing Weight: 85.8 kg  Vital Signs: Temp: 99.4 F (37.4 C) (08/03 0224) Temp Source: Oral (08/03 0224) BP: 87/62 (08/03 0253) Pulse Rate: 115 (08/03 0253)  Labs: Recent Labs    05/09/19 0234  HGB 13.6  HCT 41.4  PLT 289  CREATININE 1.13  TROPONINIHS 463*    Estimated Creatinine Clearance: 81.2 mL/min (by C-G formula based on SCr of 1.13 mg/dL).   Medical History: Past Medical History:  Diagnosis Date  . 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 Hypertension   . Stroke (cerebrum) ([place])   . Syncope     Medications:  Scheduled:  . aspirin EC  81 mg Oral Daily  . atorvastatin  40 mg Oral q1800  . heparin  4,000 Units Intravenous Once  . losartan  100 mg Oral Daily   And  . hydrochlorothiazide  25 mg Oral Daily  . potassium chloride SA  20 mEq Oral Daily  . potassium chloride  40 mEq Oral Once    Assessment: Patient admitted tonight for syncope were he was going to bathroom and supposedly woke up to his wife freaking out, patient LOC and this is the second episode and happens at night while going to the bathroom. Upon arrival EKG shows some ST depression in leads V5 w/ repolarization abnormalities suggesting ischemia,  initial trop is 463. Patient is not on any anticoagulation PTA.  Patient is being started on heparin drip for probably NSTEMI  Goal of Therapy:  Heparin level 0.3-0.7 units/ml Monitor platelets by anticoagulation protocol: Yes   Plan:  Will bolus w/ heparin 4000 units IV x 1 Will start rate at 1200 units/hr. Will check anti-Xa @ 1100 (6 hours post start). Baseline CBC WNL, baseline aPTT/INR pending. Will continue to monitor daily CBC's and adjust per anti-Xa levels.  Tobie Lords, PharmD,  BCPS Clinical Pharmacist 05/09/2019,4:29 AM

## 2019-05-09 NOTE — Plan of Care (Signed)
  Problem: Education: Goal: Knowledge of General Education information will improve Description Including pain rating scale, medication(s)/side effects and non-pharmacologic comfort measures Outcome: Progressing   Problem: Clinical Measurements: Goal: Cardiovascular complication will be avoided Outcome: Progressing   Problem: Safety: Goal: Ability to remain free from injury will improve Outcome: Progressing   

## 2019-05-09 NOTE — ED Triage Notes (Addendum)
EMS pt from home who says he was going to the bathroom to void and the next thing he remembers lights were on all over the house and his wife was panicking; pt says he thinks he fell down to his knees; pt arrives awake and alert; talking in complete coherent sentences; pt says he had a similar episode a few days ago but did not seek medical attention; that episode also occurred at night when he was going to the bathroom

## 2019-05-09 NOTE — H&P (Signed)
[facility] [place] NAME: Tyler Gomez    MR#:  [id number]  DATE OF BIRTH:  02/02/1956  DATE OF ADMISSION:  05/09/2019  PRIMARY CARE PHYSICIAN: Arnetha Courser, MD   REQUESTING/REFERRING PHYSICIAN:[place] Alfred Levins, MD  CHIEF COMPLAINT:   Chief Complaint  Patient presents with   Loss of Consciousness    HISTORY OF PRESENT ILLNESS:  Tyler Gomez  is a 63 y.o. male with a known history of ischemic right ACA stroke, hypertension, hyperlipidemia.  He presents to the emergency room after his second syncopal episode over the last 2 nights.  His initial episode was while going to the restroom in the middle of the night at home on Saturday night with repeated episode tonight.  Both episodes were witnessed by the patient's wife.  He was supported to the ground by his wife at the time of both episodes with no injuries sustained.  Patient's wife reports at the time of both episodes, patient reportedly feeling weak then becoming unconscious usually lasting about 30 seconds.  Patient denies experiencing chest pain or shortness of breath.  He denies palpitations.  He denies unilateral weakness.  He denies blurred vision or slurred speech.  No diplopia.  No dysphasia.  He denies recent illness.  He denies fevers, chills, nausea, vomiting, diarrhea.  He denies shortness of breath.  However patient has noted occasional nonproductive cough.  COVID-19 testing is pending.  Patient has a loop recorder in place since 2018 at the time of the stroke.  He reportedly follows up with [facility].  On arrival to the emergency room, troponin is 463 with potassium 2.9.  Patient was orthostatic on arrival.  He is being started on heparin infusion with ST depression noted in the lateral leads in the emergency room as well.  Cardiology has been consulted for further evaluation and recommendations.  He has been admitted to the hospital service for further  management.  PAST MEDICAL HISTORY:   Past Medical History:  Diagnosis Date   History of ischemic right ACA stroke 07/08/2017   Hypertension    Stroke (cerebrum) ([place])    Syncope     PAST SURGICAL HISTORY:   Past Surgical History:  Procedure Laterality Date   LOOP RECORDER INSERTION N/A 07/10/2017   Procedure: LOOP RECORDER INSERTION;  Surgeon: Evans Lance, MD;  Location: [place] CV LAB;  Service: Cardiovascular;  Laterality: N/A;   TEE WITHOUT CARDIOVERSION N/A 07/10/2017   Procedure: TRANSESOPHAGEAL ECHOCARDIOGRAM (TEE) WITH LOOP;  Surgeon: Skeet Latch, MD;  Location: [place];  Service: Cardiovascular;  Laterality: N/A;    SOCIAL HISTORY:   Social History   Tobacco Use   Smoking status: Never Smoker   Smokeless tobacco: Never Used  Substance Use Topics   Alcohol use: No    FAMILY HISTORY:   Family History  Problem Relation Age of Onset   Heart attack Father     DRUG ALLERGIES:  No Known Allergies  REVIEW OF SYSTEMS:   Review of Systems  Constitutional: Negative for chills, fever and malaise/fatigue.  HENT: Negative for congestion, sinus pain and sore throat.   Eyes: Negative for blurred vision and double vision.  Respiratory: Negative for cough, shortness of breath and wheezing.   Cardiovascular: Negative for chest pain, palpitations and leg swelling.  Gastrointestinal: Negative for abdominal pain, blood in stool, constipation, diarrhea, heartburn, nausea and vomiting.  Genitourinary: Negative for dysuria, flank pain and hematuria.  Musculoskeletal: Positive for back pain (  low back pain). Negative for falls, joint pain and myalgias.  Neurological: Positive for loss of consciousness. Negative for dizziness, weakness and headaches.  Psychiatric/Behavioral: Negative for depression.   MEDICATIONS AT HOME:   Prior to Admission medications   Medication Sig Start Date End Date Taking? Authorizing Provider  atorvastatin (LIPITOR) 40 MG  tablet Take one by mouth at bedtime for cholesterol 12/29/17  Yes Lada, Janit BernMelinda P, MD  losartan-hydrochlorothiazide (HYZAAR) 100-25 MG tablet Take 1 tablet by mouth daily. 12/29/17  Yes Lada, Janit BernMelinda P, MD  potassium chloride SA (K-DUR,KLOR-CON) 20 MEQ tablet TAKE 1 TABLET(20 MEQ) BY MOUTH DAILY 12/29/17  Yes Lada, Janit BernMelinda P, MD      VITAL SIGNS:  Blood pressure (!) 87/62, pulse (!) 115, temperature 99.4 F (37.4 C), temperature source Oral, resp. rate (!) 21, height 6' (1.829 m), weight 98.1 kg, SpO2 100 %.  PHYSICAL EXAMINATION:  Physical Exam  GENERAL: Pleasant 63 y.o.-year-old patient lying in the bed with no acute distress.  EYES: Pupils equal, round, reactive to light and accommodation. No scleral icterus. Extraocular muscles intact.  HEENT: Head atraumatic, normocephalic. Oropharynx and nasopharynx clear.  NECK:  Supple, no jugular venous distention. No thyroid enlargement, no tenderness.  LUNGS: Normal breath sounds bilaterally, no wheezing, rales,rhonchi or crepitation. No use of accessory muscles of respiration.  CARDIOVASCULAR: Regular rate and rhythm, S1, S2 normal. No murmurs, rubs, or gallops.  ABDOMEN: Soft, nondistended, nontender. Bowel sounds present. No organomegaly or mass.  EXTREMITIES: No pedal edema, cyanosis, or clubbing.  NEUROLOGIC: Cranial nerves II through XII are intact. Muscle strength 5/5 in all extremities. Sensation intact. Gait not checked.  PSYCHIATRIC: The patient is alert and oriented x 3.  Normal affect and good eye contact. SKIN: No obvious rash, lesion, or ulcer.   LABORATORY PANEL:   CBC Recent Labs  Lab 05/09/19 0234  WBC 11.3*  HGB 13.6  HCT 41.4  PLT 289   ------------------------------------------------------------------------------------------------------------------  Chemistries  Recent Labs  Lab 05/09/19 0234  NA 133*  K 2.9*  CL 96*  CO2 24  GLUCOSE 118*  BUN 15  CREATININE 1.13  CALCIUM 8.9  AST 51*  ALT 36  ALKPHOS 47   BILITOT 1.4*   ------------------------------------------------------------------------------------------------------------------  Cardiac Enzymes No results for input(s): TROPONINI in the last 168 hours. ------------------------------------------------------------------------------------------------------------------  RADIOLOGY:  No results found.    IMPRESSION AND PLAN:   1.  NSTEMI - Started on heparin infusion -Continue to trend troponin levels -Echocardiogram - Telemetry monitoring - Cardiology consulted, Dr. Cristal Deerhristopher, for further evaluation and recommendations -Repeat EKG in the a.m. - Lipitor continued -Lipid panel pending  2.  Syncope with collapse - CT brain is pending given his patient's history of CVA -Carotid Dopplers - Echocardiogram -Telemetry monitoring - Continue neurological checks  3.  Hyperlipidemia - Statin therapy continue -Lipid panel pending  4.  Hypertension - Orthostatic hypotension on arrival  5. hypokalemia -Patient is receiving p.o. potassium replacement - We will repeat BMP in the a.m. - Telemetry monitoring  DVT and PPI prophylaxis    All the records are reviewed and case discussed with ED provider. The plan of care was discussed in details with the patient (and family). I answered all questions. The patient agreed to proceed with the above mentioned plan. Further management will depend upon hospital course.   CODE STATUS: Full code  TOTAL TIME TAKING CARE OF THIS PATIENT: 45 minutes.    Milas Kocherngela H Leibish Mcgregor CRNP on 05/09/2019 at 4:16 AM  Pager - [phone]  After 6pm go to [email or web address] - Social research officer, governmentpassword [email or web address] [email or web address]  [employer] [place] [facility]  Office  [phone]  CC: Primary care physician; Kerman PasseyLada, [name], MD   Note: This dictation was prepared with Dragon dictation along with smaller phrase technology. Any transcriptional errors that result from this process are unintentional.

## 2019-05-09 NOTE — Progress Notes (Signed)
ANTICOAGULATION CONSULT NOTE - Initial Consult  Pharmacy Consult for heparin Indication: chest pain/ACS  No Known Allergies  Patient Measurements: Height: 6' (182.9 cm) Weight: 199 lb 14.4 oz (90.7 kg) IBW/kg (Calculated) : 77.6 Heparin Dosing Weight: 85.8 kg  Vital Signs: Temp: 100.2 F (37.9 C) (08/03 0804) Temp Source: Oral (08/03 0804) BP: 121/78 (08/03 0804) Pulse Rate: 83 (08/03 0804)  Labs: Recent Labs    05/09/19 0234 05/09/19 0447 05/09/19 0639 05/09/19 1336  HGB 13.6  --   --   --   HCT 41.4  --   --   --   PLT 289  --   --   --   APTT  --  28  --   --   LABPROT  --  15.2  --   --   INR  --  1.2  --   --   HEPARINUNFRC  --   --   --  0.31  CREATININE 1.13  --   --   --   TROPONINIHS 463* 849* 796*  --     Estimated Creatinine Clearance: 73.4 mL/min (by C-G formula based on SCr of 1.13 mg/dL).   Medical History: Past Medical History:  Diagnosis Date  . 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 Hypertension   . Stroke (cerebrum) ([place])   . Syncope     Medications:  Scheduled:  . aspirin EC  81 mg Oral Daily  . atorvastatin  40 mg Oral q1800  . losartan  100 mg Oral Daily  . pantoprazole (PROTONIX) IV  40 mg Intravenous Q24H  . potassium chloride SA  20 mEq Oral Daily  . sodium chloride flush  3 mL Intravenous Q12H   Troponin 463 >> 849 >> 796  Assessment: Patient admitted tonight for syncope were he was going to bathroom and supposedly woke up to his wife freaking out, patient LOC and this is the second episode and happens at night while going to the bathroom. Upon arrival EKG shows some ST depression in leads V5 w/ repolarization abnormalities suggesting ischemia,   Patient is not on any anticoagulation PTA.  Patient is being started on heparin drip for probably NSTEMI.  8/3 HL @1336 : 0.31. Therapeutic. No interupptions noted by nurse. Of note, HL was supposed to be drawn at 1100 today, but phlebotomist was unable to obtain the lab  at that time due to a test being conducted in patient's room.    Goal of Therapy:  Heparin level 0.3-0.7 units/ml Monitor platelets by anticoagulation protocol: Yes   Plan:  8/3 HL @1336 : 0.31. Therapeutic x1.  Continue rate at 1200 units/hr. Will check anti-Xa in 6 hours.  Will continue to monitor daily CBC's and adjust per anti-Xa levels.  Kristeen Miss, PharmD Clinical Pharmacist  05/09/2019,2:19 PM

## 2019-05-09 NOTE — ED Notes (Signed)
ED TO INPATIENT HANDOFF REPORT  ED Nurse Name and Phone #: laurie, rn   S Name/Age/Gender Tyler Gomez 63 y.o. male Room/Bed: ED10A/ED10A  Code Status   Code Status: Full Code  Home/SNF/Other Home Patient oriented to: self, place, time and situation Is this baseline? Yes   Triage Complete: Triage complete  Chief Complaint [facility] - Near syncope  Triage Note EMS pt from home who says he was going to the bathroom to void and the next thing he remembers lights were on all over the house and his wife was panicking; pt says he thinks he fell down to his knees; pt arrives awake and alert; talking in complete coherent sentences; pt says he had a similar episode a few days ago but did not seek medical attention; that episode also occurred at night when he was going to the bathroom   Allergies No Known Allergies  Level of Care/Admitting Diagnosis ED Disposition    ED Disposition Condition Comment   Admit  Hospital Area[facility] [[id number]]  Level of Care: Telemetry [5]  Covid Evaluation: Asymptomatic Screening Protocol (No Symptoms)  Diagnosis: Chest pain in adult [[id number]][[id number]]  Admitting Physician: Pearletha AlfredSEALS, ANGELA H [[id number]]  Attending Physician: Pearletha AlfredSEALS, ANGELA H [[id number]]  Estimated length of stay: past midnight tomorrow  Certification:: I certify this patient will need inpatient services for at least 2 midnights  PT Class (Do Not Modify): Inpatient [101]  PT Acc Code (Do Not Modify): Private [1]       B Medical/Surgery History Past Medical History:  Diagnosis Date  . 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 Hypertension   . Stroke (cerebrum) (HCC)   . Syncope    Past Surgical History:  Procedure Laterality Date  . LOOP RECORDER INSERTION N/A 07/10/2017   Procedure: LOOP RECORDER INSERTION;  Surgeon: Marinus Mawaylor, Gregg W, MD;  [facility];  Service: Cardiovascular;  Laterality: N/A;  . TEE WITHOUT CARDIOVERSION N/A  07/10/2017   Procedure: TRANSESOPHAGEAL ECHOCARDIOGRAM (TEE) WITH LOOP;  Surgeon: Chilton Siandolph, Tiffany, MD;  [facility];  Service: Cardiovascular;  Laterality: N/A;     A IV Location/Drains/Wounds Patient Lines/Drains/Airways Status   Active Line/Drains/Airways    Name:   Placement date:   Placement time:   Site:   Days:   Peripheral IV 05/09/19 Left Forearm   05/09/19    0308    Forearm   less than 1          Intake/Output Last 24 hours No intake or output data in the 24 hours ending 05/09/19 0456  Labs/Imaging Results for orders placed or performed during the hospital encounter of 05/09/19 (from the past 48 hour(s))  CBC with Differential/Platelet     Status: Abnormal   Collection Time: 05/09/19  2:34 AM  Result Value Ref Range   WBC 11.3 (H) 4.0 - 10.5 K/uL   RBC 4.78 4.22 - 5.81 MIL/uL   Hemoglobin 13.6 13.0 - 17.0 g/dL   HCT 52.841.4 41.339.0 - 24.452.0 %   MCV 86.6 80.0 - 100.0 fL   MCH 28.5 26.0 - 34.0 pg   MCHC 32.9 30.0 - 36.0 g/dL   RDW 01.011.9 27.211.5 - 53.615.5 %   Platelets 289 150 - 400 K/uL   nRBC 0.0 0.0 - 0.2 %   Neutrophils Relative % 84 %   Neutro Abs 9.4 (H) 1.7 - 7.7 K/uL   Lymphocytes Relative 8 %   Lymphs Abs 0.9 0.7 - 4.0 K/uL  Monocytes Relative 8 %   Monocytes Absolute 0.9 0.1 - 1.0 K/uL   Eosinophils Relative 0 %   Eosinophils Absolute 0.0 0.0 - 0.5 K/uL   Basophils Relative 0 %   Basophils Absolute 0.0 0.0 - 0.1 K/uL   Immature Granulocytes 0 %   Abs Immature Granulocytes 0.05 0.00 - 0.07 K/uL    Comment: Performed at [facility], [address] [place], [place] [address]  Comprehensive metabolic panel     Status: Abnormal   Collection Time: 05/09/19  2:34 AM  Result Value Ref Range   Sodium 133 (L) 135 - 145 mmol/L   Potassium 2.9 (L) 3.5 - 5.1 mmol/L   Chloride 96 (L) 98 - 111 mmol/L   CO2 24 22 - 32 mmol/L   Glucose, Bld 118 (H) 70 - 99 mg/dL   BUN 15 8 - 23 mg/dL   Creatinine, Ser 1.13 0.61 - 1.24 mg/dL   Calcium 8.9 8.9 - 10.3  mg/dL   Total Protein 9.4 (H) 6.5 - 8.1 g/dL   Albumin 3.9 3.5 - 5.0 g/dL   AST 51 (H) 15 - 41 U/L   ALT 36 0 - 44 U/L   Alkaline Phosphatase 47 38 - 126 U/L   Total Bilirubin 1.4 (H) 0.3 - 1.2 mg/dL   GFR calc non Af Amer >60 >60 mL/min   GFR calc Af Amer >60 >60 mL/min   Anion gap 13 5 - 15    Comment: Performed at [facility], [address] [place], [place] [address]  Troponin I (High Sensitivity)     Status: Abnormal   Collection Time: 05/09/19  2:34 AM  Result Value Ref Range   Troponin I (High Sensitivity) 463 (HH) <18 ng/L    Comment: CRITICAL RESULT CALLED TO, READ BACK BY AND VERIFIED WITH LORI ALLEN ON 05/09/19 AT 0327 [facility] (NOTE) Elevated high sensitivity troponin I (hsTnI) values and significant  changes across serial measurements may suggest ACS but many other  chronic and acute conditions are known to elevate hsTnI results.  Refer to the "Links" section for chest pain algorithms and additional  guidance. Performed at [facility], [address] [place], [place] [address]    No results found.  Pending Labs Unresulted Labs (From admission, onward)    Start     Ordered   05/10/19 0500  CBC  Daily,   STAT     05/09/19 0429   05/09/19 1100  Heparin level (unfractionated)  Once-Timed,   STAT     05/09/19 0429   05/09/19 0428  APTT  ONCE - STAT,   STAT     05/09/19 0429   05/09/19 0428  Protime-INR  ONCE - STAT,   STAT     05/09/19 0429   05/09/19 0421  HIV antibody (Routine Testing)  Once,   STAT     05/09/19 0420   05/09/19 0421  Lipid panel  Once,   STAT     05/09/19 0420   05/09/19 0358  Magnesium  Add-on,   AD     05/09/19 0357   05/09/19 0344  SARS Coronavirus 2 [facility] order, Performed in [facility] hospital lab) Nasopharyngeal Nasopharyngeal Swab  (Asymptomatic Patients Labs)  Once,   STAT    Question Answer Comment  Is this test for diagnosis or screening Screening   Symptomatic for COVID-19 as defined by CDC No   Hospitalized  for COVID-19 No   Admitted to ICU for COVID-19 No  Previously tested for COVID-19 No   Resident in a congregate (group) care setting No   Employed in healthcare setting No      05/09/19 0343   05/09/19 0227  Urinalysis, Complete w Microscopic  ONCE - STAT,   STAT     05/09/19 0227          Vitals/Pain Today's Vitals   05/09/19 0330 05/09/19 0345 05/09/19 0400 05/09/19 0415  BP: 116/70 (!) 122/94 127/78 129/75  Pulse: 83   87  Resp: 17 (!) 21 14 20   Temp:      TempSrc:      SpO2: 100%   99%  Weight:      Height:      PainSc:        Isolation Precautions No active isolations  Medications Medications  potassium chloride SA (K-DUR) CR tablet 40 mEq (has no administration in time range)  atorvastatin (LIPITOR) tablet 40 mg (has no administration in time range)  potassium chloride SA (K-DUR) CR tablet 20 mEq (has no administration in time range)  acetaminophen (TYLENOL) tablet 650 mg (has no administration in time range)  ondansetron (ZOFRAN) injection 4 mg (has no administration in time range)  aspirin EC tablet 81 mg (has no administration in time range)  losartan (COZAAR) tablet 100 mg (has no administration in time range)    And  hydrochlorothiazide (HYDRODIURIL) tablet 25 mg (has no administration in time range)  heparin bolus via infusion 4,000 Units (has no administration in time range)  heparin ADULT infusion 100 units/mL (25000 units/24950mL sodium chloride 0.45%) (has no administration in time range)  ALPRAZolam (XANAX) tablet 0.5 mg (has no administration in time range)  pantoprazole (PROTONIX) injection 40 mg (has no administration in time range)  sodium chloride 0.9 % bolus 1,000 mL (1,000 mLs Intravenous New Bag/Given 05/09/19 0309)    Mobility walks High fall risk   Focused Assessments Cardiac Assessment Handoff:  Cardiac Rhythm: Normal sinus rhythm Lab Results  Component Value Date   TROPONINI <0.03 09/15/2016   No results found for: DDIMER Does  the Patient currently have chest pain? No     R Recommendations: See Admitting Provider Note  Report given to:   Additional Notes:

## 2019-05-09 NOTE — Progress Notes (Signed)
Tyler Gomez for heparin Indication: chest pain/ACS  No Known Allergies  Patient Measurements: Height: 6' (182.9 cm) Weight: 199 lb 14.4 oz (90.7 kg) IBW/kg (Calculated) : 77.6 Heparin Dosing Weight: 85.8 kg  Vital Signs: Temp: 100.1 F (37.8 C) (08/03 2030) Temp Source: Oral (08/03 2030) BP: 67/48 (08/03 1936) Pulse Rate: 100 (08/03 1936)  Labs: Recent Labs    05/09/19 0234 05/09/19 0447 05/09/19 0639 05/09/19 1336 05/09/19 1919  HGB 13.6  --   --   --   --   HCT 41.4  --   --   --   --   PLT 289  --   --   --   --   APTT  --  28  --   --   --   LABPROT  --  15.2  --   --   --   INR  --  1.2  --   --   --   HEPARINUNFRC  --   --   --  0.31 0.44  CREATININE 1.13  --   --   --   --   CKTOTAL  --   --  216  --   --   TROPONINIHS 463* 849* 796*  --   --     Estimated Creatinine Clearance: 73.4 mL/min (by C-G formula based on SCr of 1.13 mg/dL).   Medical History: Past Medical History:  Diagnosis Date  . 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 Hypertension   . Stroke (cerebrum) ([place])   . Syncope     Medications:  Scheduled:  . aspirin EC  81 mg Oral Daily  . atorvastatin  40 mg Oral q1800  . losartan  100 mg Oral Daily  . pantoprazole (PROTONIX) IV  40 mg Intravenous Q24H  . potassium chloride SA  20 mEq Oral Daily  . sodium chloride flush  3 mL Intravenous Q12H   Troponin 463 >> 849 >> 796  Assessment: Patient admitted tonight for syncope were he was going to bathroom and supposedly woke up to his wife freaking out, patient LOC and this is the second episode and happens at night while going to the bathroom. Upon arrival EKG shows some ST depression in leads V5 w/ repolarization abnormalities suggesting ischemia,   Patient is not on any anticoagulation PTA.  Patient is being started on heparin drip for probably NSTEMI.  8/3 HL @1336 : 0.31. Therapeutic. No interupptions noted by nurse. Of note, HL was  supposed to be drawn at 1100 today, but phlebotomist was unable to obtain the lab at that time due to a test being conducted in patient's room.    Goal of Therapy:  Heparin level 0.3-0.7 units/ml Monitor platelets by anticoagulation protocol: Yes   Plan:  8/3 HL @1336 : 0.44. Therapeutic x2.  Continue rate at 1200 units/hr. Will check anti-Xa with am labs Will continue to monitor daily CBC's and adjust per anti-Xa levels.  Chinita Greenland PharmD Clinical Pharmacist 05/09/2019

## 2019-05-09 NOTE — ED Notes (Signed)
Dr Alfred Levins has been informed of elevated troponin and is in room to follow up with pt

## 2019-05-09 NOTE — ED Notes (Signed)
Pt to CT and will be transported to floor from radiology by ED tech Wilfred Lacy

## 2019-05-09 NOTE — Consult Note (Signed)
Cardiology Consultation:   Patient ID: Tyler Gomez; [id number]; 10/28/1955   Admit date: 05/09/2019 Date of Consult: 05/09/2019  Primary Care Provider: Arnetha Courser, MD Primary Cardiologist: Gevena Cotton Electrophysiologist:  Lovena Le   Patient Profile:   Tyler Gomez is a 63 y.o. male with a hx of cryptogenic stroke in 06/2017, syncope in 07/2017 dating back to > 10 years prior who is being seen today for the evaluation of elevated troponin at the request of Gardiner Barefoot, NP.  History of Present Illness:   Tyler Gomez was previously admitted to the hospital in 06/2017 with a cryptogenic stroke involving the right ACA territory. Work up at that time including a TEE which showed an EF of 55-60%, no RWMA, mild MR with multiple jets, no thrombus was noted, no defect or PFO. Patient underwent ILR implantation which has not revealed any significant arrhythmia, high grade AV block, or pauses. His syncope dates back to > 10 years prior with some concern these episodes may have been in the setting of hypoglycemia. No prior ischemic evaluation on file for review.   Patient presented to [facility] with 2 syncopal episodes over the prior two nights, both associated with micturition.  Upon the patient's arrival to [facility] they were found to have BP ranging from 132 to 87 mmHg systolic, HR 92--119 bpm, t-max 100.2, oxygen saturation 100% on room air, weight somewhere between 98.1 and 90.7 kg. EKG showed sinus rhythm with diffuse st/t changes and anterolateral TWI as outlined below, CXR was not performed. CT head was not acute with small remote right ACA territory infarct. Labs showed hs-Tn 463-->849-->796, potassium 2.9, SCr 1.13, sodium 133, glucose 118, albumin 3.9, AST/ALT normal, WBC 11.3, HGB 13.6, magnesium 2.3, TG 73, LDL 51. Orthostatic vitals signs were not obtained. Patient has received ASA 81 mg, Xanax, Protonix, IV fluids and was placed on a heparin gtt. Cardiology asked to see for  chest pain.   Patient presents with a 1 week history of subjective fevers, chills, nasal congestion, postnasal drip, and nonproductive cough.  He has been taking Coricidin HBP for this at home, sometimes more than the recommended dose as he forgets if he has taken this.  He has also been constipated and straining.  He contacted EMS for dizziness in the setting and was advised to increase fiber, and water intake and limit straining.  Patient has a long history of passing out dating back greater than 10 years prior which has been associated with dehydration and orthostasis.  Patient indicates he works regularly and does not drink enough water.  With his exercise regimen he has never experienced chest pain or shortness of breath.  Patient got up to void 2 nights prior and was straining, with this he felt dizzy and was able to get to the floor.  This again happened overnight into 8/3, and was also associated with attempting to void.  Note indicates patient was possibly without consciousness for about 30 seconds though patient is unsure of this.  He denies any associated chest pain, shortness of breath, or palpitations.  Past Medical History:  Diagnosis Date  . 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 Hypertension   . Stroke (cerebrum) ([place])   . Syncope     Past Surgical History:  Procedure Laterality Date  . LOOP RECORDER INSERTION N/A 07/10/2017   Procedure: LOOP RECORDER INSERTION;  Surgeon: Evans Lance, MD;  Location: [place] CV LAB;  Service: Cardiovascular;  Laterality: N/A;  .  TEE WITHOUT CARDIOVERSION N/A 07/10/2017   Procedure: TRANSESOPHAGEAL ECHOCARDIOGRAM (TEE) WITH LOOP;  Surgeon: Chilton Siandolph, Tiffany, MD;  [facility];  Service: Cardiovascular;  Laterality: N/A;     Home Meds: Prior to Admission medications   Medication Sig Start Date End Date Taking? Authorizing Provider  atorvastatin (LIPITOR) 40 MG tablet Take one by mouth at bedtime for cholesterol 12/29/17   Yes Lada, Janit BernMelinda P, MD  losartan-hydrochlorothiazide (HYZAAR) 100-25 MG tablet Take 1 tablet by mouth daily. 12/29/17  Yes Lada, Janit BernMelinda P, MD  potassium chloride SA (K-DUR,KLOR-CON) 20 MEQ tablet TAKE 1 TABLET(20 MEQ) BY MOUTH DAILY 12/29/17  Yes Lada, Janit BernMelinda P, MD    Inpatient Medications: Scheduled Meds: . aspirin EC  81 mg Oral Daily  . atorvastatin  40 mg Oral q1800  . losartan  100 mg Oral Daily  . pantoprazole (PROTONIX) IV  40 mg Intravenous Q24H  . potassium chloride SA  20 mEq Oral Daily   Continuous Infusions: . heparin 1,200 Units/hr (05/09/19 0557)   PRN Meds: acetaminophen, ondansetron (ZOFRAN) IV  Allergies:  No Known Allergies  Social History:   Social History   Socioeconomic History  . Marital status: Married    Spouse name: Not on file  . Number of children: Not on file  . Years of education: Not on file  . Highest education level: Not on file  Occupational History  . Not on file  Social Needs  . Financial resource strain: Not on file  . Food insecurity    Worry: Not on file    Inability: Not on file  . Transportation needs    Medical: Not on file    Non-medical: Not on file  Tobacco Use  . Smoking status: Never Smoker  . Smokeless tobacco: Never Used  Substance and Sexual Activity  . Alcohol use: No  . Drug use: No  . Sexual activity: Yes  Lifestyle  . Physical activity    Days per week: Not on file    Minutes per session: Not on file  . Stress: Not on file  Relationships  . Social Musicianconnections    Talks on phone: Not on file    Gets together: Not on file    Attends religious service: Not on file    Active member of club or organization: Not on file    Attends meetings of clubs or organizations: Not on file    Relationship status: Not on file  . Intimate partner violence    Fear of current or ex partner: Not on file    Emotionally abused: Not on file    Physically abused: Not on file    Forced sexual activity: Not on file  Other Topics  Concern  . Not on file  Social History Narrative  . Not on file     Family History:   Family History  Problem Relation Age of Onset  . Heart attack Father     ROS:  Review of Systems  Constitutional: Positive for chills, fever and malaise/fatigue. Negative for diaphoresis and weight loss.  HENT: Positive for congestion and sore throat.   Eyes: Negative for discharge and redness.  Respiratory: Negative for cough, hemoptysis, sputum production, shortness of breath and wheezing.   Cardiovascular: Negative for chest pain, palpitations, orthopnea, claudication, leg swelling and PND.  Gastrointestinal: Negative for abdominal pain, blood in stool, heartburn, melena, nausea and vomiting.  Genitourinary: Negative for hematuria.  Musculoskeletal: Negative for falls and myalgias.  Skin: Negative for rash.  Neurological:  Positive for dizziness, loss of consciousness and weakness. Negative for tingling, tremors, sensory change, speech change and focal weakness.  Endo/Heme/Allergies: Does not bruise/bleed easily.  Psychiatric/Behavioral: Negative for substance abuse. The patient is not nervous/anxious.   All other systems reviewed and are negative.     Physical Exam/Data:   Vitals:   05/09/19 0345 05/09/19 0400 05/09/19 0415 05/09/19 0528  BP: (!) 122/94 127/78 129/75 96/83  Pulse:   87 94  Resp: (!) 21 14 20 20   Temp:    98.7 F (37.1 C)  TempSrc:    Oral  SpO2:   99% 100%  Weight:    90.7 kg  Height:        Intake/Output Summary (Last 24 hours) at 05/09/2019 0739 Last data filed at 05/09/2019 0532 Gross per 24 hour  Intake -  Output 0 ml  Net 0 ml   Filed Weights   05/09/19 0225 05/09/19 0528  Weight: 98.1 kg 90.7 kg   Body mass index is 27.11 kg/m.   Physical Exam: General: Well developed, well nourished, in no acute distress. Head: Normocephalic, atraumatic, sclera non-icteric, no xanthomas, nares without discharge.  Neck: Negative for carotid bruits. JVD not elevated.  Lungs: Clear bilaterally to auscultation without wheezes, rales, or rhonchi. Breathing is unlabored. Heart: RRR with S1 S2. No murmurs, rubs, or gallops appreciated. Abdomen: Soft, non-tender, non-distended with normoactive bowel sounds. No hepatomegaly. No rebound/guarding. No obvious abdominal masses. Msk:  Strength and tone appear normal for age. Extremities: No clubbing or cyanosis. No edema. Distal pedal pulses are 2+ and equal bilaterally. Neuro: Alert and oriented X 3. No facial asymmetry. No focal deficit. Moves all extremities spontaneously. Psych:  Responds to questions appropriately with a normal affect.   EKG:  The EKG was personally reviewed and demonstrates: NSR, 94 bpm, diffuse st/t changes with anterolateral TWI Telemetry:  Telemetry was personally reviewed and demonstrates: SR to sinus tach into the 120s, underlying artifact  Weights: Filed Weights   05/09/19 0225 05/09/19 0528  Weight: 98.1 kg 90.7 kg    Relevant CV Studies: TEE 07/2017: - Left ventricle: Systolic function was normal. The estimated   ejection fraction was in the range of 55% to 60%. Wall motion was   normal; there were no regional wall motion abnormalities. - Mitral valve: There was mild regurgitation, with multiple jets. - Left atrium: No evidence of thrombus in the atrial cavity or   appendage. No evidence of thrombus in the atrial cavity or   appendage. - Right atrium: No evidence of thrombus in the atrial cavity or   appendage. - Atrial septum: No defect or patent foramen ovale was identified   by color flow Doppler or saline microcavitation study.  Laboratory Data:  Chemistry Recent Labs  Lab 05/09/19 0234  NA 133*  K 2.9*  CL 96*  CO2 24  GLUCOSE 118*  BUN 15  CREATININE 1.13  CALCIUM 8.9  GFRNONAA >60  GFRAA >60  ANIONGAP 13    Recent Labs  Lab 05/09/19 0234  PROT 9.4*  ALBUMIN 3.9  AST 51*  ALT 36  ALKPHOS 47  BILITOT 1.4*   Hematology Recent Labs  Lab 05/09/19  0234  WBC 11.3*  RBC 4.78  HGB 13.6  HCT 41.4  MCV 86.6  MCH 28.5  MCHC 32.9  RDW 11.9  PLT 289   Cardiac EnzymesNo results for input(s): TROPONINI in the last 168 hours. No results for input(s): TROPIPOC in the last 168 hours.  BNPNo results for  input(s): BNP, PROBNP in the last 168 hours.  DDimer No results for input(s): DDIMER in the last 168 hours.  Radiology/Studies:  Ct Head Wo Contrast  Result Date: 05/09/2019 IMPRESSION: 1. No acute finding. 2. Small remote right ACA distribution infarct. 3. Known sellar and suprasellar Rathke's cleft cyst. Electronically Signed   By: Marnee SpringJonathon  Watts M.D.   On: 05/09/2019 05:25    Assessment and Plan:   1. Syncope: -Long standing issue that dates back to > 10 years prior -Most recent episodes associated with micturition -Patient exercises vigorously and has never experienced symptoms consistent with angina though he does note he is consistently dehydrated -History and symptoms less concerning for cardiac etiology -Cannot exclude micturition associated syncope as well as orthostasis/dehydration -Recent ILR device interrogation from 04/20/2019 showed no significant arrhythmia, high grade AV block or pause -Will have our office interrogate device -Check echo -With elevated troponin and abnormal EKG, albeit without any subjective complaint consistent with ACS, he will require an ischemic evaluation prior to discharge  -Would obtain orthostatic vitals, though he has been receiving IV fluids, so these will not be indicative of his presentation  -He was noted to be significantly hypokalemic upon arrival with recommendation to replete potassium to goal of 4.0 as below as well as interrogate ILR -Carotid artery ultrasound   2. Elevated troponin/abnormal EKG: -Not consistent with NSTEMI -Never with complaint of chest pain -Remains on heparin gtt at this time -Check echo, if unrevealing would stop heparin gtt -Will require ischemic evaluation  prior to discharge, await echo for further recommendations  -ASA  3. Hypokalemia: -Replete to goal of 4.0 -Patient on Hyzaar at home, may need to increase supplemental KCl at discharge   4. Prior right ACA territory cryptogenic stroke: -Status post prior TEE and ILR -ASA -LDL at goal  5. HLD: -LDL of 51 this admission -Lipitor  6. Fever/leukocytosis: -Initial COVID swab were negative -Patient has noted significant congestion with cough -He has been taking Coricidin HBP, sometimes more than the recommended dose as he forgets if he has taken this or not  -Recommend further work up per IM, including CX   For questions or updates, please contact [facility] Please consult [email or web address] for contact info under Cardiology/STEMI.   Signed, Eula Listenyan Antwione Picotte, PA-C [facility] Pager: [phone] 05/09/2019, 7:39 AM

## 2019-05-09 NOTE — Plan of Care (Signed)
  Problem: Education: Goal: Knowledge of General Education information will improve Description: Including pain rating scale, medication(s)/side effects and non-pharmacologic comfort measures Outcome: Progressing   Problem: Health Behavior/Discharge Planning: Goal: Ability to manage health-related needs will improve Outcome: Progressing   Problem: Clinical Measurements: Goal: Ability to maintain clinical measurements within normal limits will improve Outcome: Progressing Note: Fever reduced without medication. Patient is positive for orthostatics. Patient was symptomatic while standing.   Problem: Cardiac: Goal: Ability to achieve and maintain adequate cardiovascular perfusion will improve Outcome: Progressing   Problem: Activity: Goal: Ability to tolerate increased activity will improve Outcome: Not Progressing

## 2019-05-09 NOTE — Progress Notes (Signed)
*  PRELIMINARY RESULTS* Echocardiogram 2D Echocardiogram has been performed.  Tyler Gomez 05/09/2019, 3:00 PM

## 2019-05-09 NOTE — ED Provider Notes (Signed)
[facility] Emergency Department Provider Note  ____________________________________________  Time seen: Approximately 2:51 AM  I have reviewed the triage vital signs and the nursing notes.   HISTORY  Chief Complaint Loss of Consciousness   HPI Tyler Gomez is a 63 y.o. male with a history of a CVA, hypertension, hyperlipidemia who presents for evaluation of syncope.  This is patient's second episode of syncope in 3 days.  Both episodes happened in the middle of the night while patient was in the bathroom urinating.  Both times wife was with him and patient never fully collapsed to the ground.  Did not sustain any injuries.  Wife reports that he starts complaining that his legs are feeling weak and then he becomes unconscious for a few seconds.  No chest pain, no shortness of breath, no recent illnesses, no vomiting or diarrhea, no cough or fever.   Past Medical History:  Diagnosis Date  . 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 Hypertension   . Stroke (cerebrum) (HCC)   . Syncope     Patient Active Problem List   Diagnosis Date Noted  . Flu vaccine need 12/29/2017  . 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 Mobitz type 1 second degree atrioventricular block 07/05/2017  . CVA (cerebral vascular accident) (HCC) 07/05/2017  . Nasal sinus congestion 02/25/2016  . Elevated liver enzymes 08/28/2015  . Dyslipidemia 03/22/2015  . Encounter for general adult medical examination without abnormal findings 03/22/2015  . BP (high blood pressure) 03/22/2015  . Cardiac murmur 03/22/2015  . Diuretic-induced hypokalemia 03/22/2015    Past Surgical History:  Procedure Laterality Date  . LOOP RECORDER INSERTION N/A 07/10/2017   Procedure: LOOP RECORDER INSERTION;  Surgeon: Marinus Mawaylor, Gregg W, MD;  [facility];  Service: Cardiovascular;  Laterality: N/A;  . TEE WITHOUT CARDIOVERSION N/A 07/10/2017   Procedure: TRANSESOPHAGEAL  ECHOCARDIOGRAM (TEE) WITH LOOP;  Surgeon: Chilton Siandolph, Tiffany, MD;  [facility];  Service: Cardiovascular;  Laterality: N/A;    Prior to Admission medications   Medication Sig Start Date End Date Taking? Authorizing Provider  atorvastatin (LIPITOR) 40 MG tablet Take one by mouth at bedtime for cholesterol 12/29/17  Yes Lada, Janit BernMelinda P, MD  losartan-hydrochlorothiazide (HYZAAR) 100-25 MG tablet Take 1 tablet by mouth daily. 12/29/17  Yes Lada, Janit BernMelinda P, MD  potassium chloride SA (K-DUR,KLOR-CON) 20 MEQ tablet TAKE 1 TABLET(20 MEQ) BY MOUTH DAILY 12/29/17  Yes Lada, Janit BernMelinda P, MD    Allergies Patient has no known allergies.  Family History  Problem Relation Age of Onset  . Heart attack Father     Social History Social History   Tobacco Use  . Smoking status: Never Smoker  . Smokeless tobacco: Never Used  Substance Use Topics  . Alcohol use: No  . Drug use: No    Review of Systems  Constitutional: Negative for fever. + syncope Eyes: Negative for visual changes. ENT: Negative for sore throat. Neck: No neck pain  Cardiovascular: Negative for chest pain. Respiratory: Negative for shortness of breath. Gastrointestinal: Negative for abdominal pain, vomiting or diarrhea. Genitourinary: Negative for dysuria. Musculoskeletal: Negative for back pain. Skin: Negative for rash. Neurological: Negative for headaches, weakness or numbness. Psych: No SI or HI  ____________________________________________   PHYSICAL EXAM:  VITAL SIGNS: ED Triage Vitals  Enc Vitals Group     BP 05/09/19 0224 132/82     Pulse Rate 05/09/19 0224 98     Resp 05/09/19 0224 17  Temp 05/09/19 0224 99.4 F (37.4 C)     Temp Source 05/09/19 0224 Oral     SpO2 05/09/19 0224 100 %     Weight 05/09/19 0225 216 lb 4.3 oz (98.1 kg)     Height 05/09/19 0225 6' (1.829 m)     Head Circumference --      Peak Flow --      Pain Score 05/09/19 0225 0     Pain Loc --      Pain Edu? --      Excl. in  [place]? --     Constitutional: Alert and oriented. Well appearing and in no apparent distress. HEENT:      Head: Normocephalic and atraumatic.         Eyes: Conjunctivae are normal. Sclera is non-icteric.       Mouth/Throat: Mucous membranes are moist.       Neck: Supple with no signs of meningismus. Cardiovascular: Regular rate and rhythm. No murmurs, gallops, or rubs. 2+ symmetrical distal pulses are present in all extremities. No JVD. Respiratory: Normal respiratory effort. Lungs are clear to auscultation bilaterally. No wheezes, crackles, or rhonchi.  Gastrointestinal: Soft, non tender, and non distended with positive bowel sounds. No rebound or guarding. Musculoskeletal: Nontender with normal range of motion in all extremities. No edema, cyanosis, or erythema of extremities. Neurologic: Normal speech and language. Face is symmetric. Moving all extremities.  Intact strength and sensation x4 Skin: Skin is warm, dry and intact. No rash noted. Psychiatric: Mood and affect are normal. Speech and behavior are normal.  ____________________________________________   LABS (all labs ordered are listed, but only abnormal results are displayed)  Labs Reviewed  CBC WITH DIFFERENTIAL/PLATELET - Abnormal; Notable for the following components:      Result Value   WBC 11.3 (*)    Neutro Abs 9.4 (*)    All other components within normal limits  COMPREHENSIVE METABOLIC PANEL - Abnormal; Notable for the following components:   Sodium 133 (*)    Potassium 2.9 (*)    Chloride 96 (*)    Glucose, Bld 118 (*)    Total Protein 9.4 (*)    AST 51 (*)    Total Bilirubin 1.4 (*)    All other components within normal limits  TROPONIN I (HIGH SENSITIVITY) - Abnormal; Notable for the following components:   Troponin I (High Sensitivity) 463 (*)    All other components within normal limits  SARS CORONAVIRUS 2 (HOSPITAL ORDER, [place] [facility])  URINALYSIS, COMPLETE (UACMP) WITH  MICROSCOPIC  MAGNESIUM  TROPONIN I (HIGH SENSITIVITY)   ____________________________________________  EKG  ED ECG REPORT I, Rudene Re, the attending physician, personally viewed and interpreted this ECG.  Normal sinus rhythm, rate of 94, normal intervals, ST depressions in anterior lateral leads with inverted T waves, no ST elevation. New when compared to prior ____________________________________________  RADIOLOGY  none  ____________________________________________   PROCEDURES  Procedure(s) performed: None Procedures Critical Care performed:  Yes  CRITICAL CARE Performed by: Rudene Re  ?  Total critical care time: 35 min  Critical care time was exclusive of separately billable procedures and treating other patients.  Critical care was necessary to treat or prevent imminent or life-threatening deterioration.  Critical care was time spent personally by me on the following activities: development of treatment plan with patient and/or surrogate as well as nursing, discussions with consultants, evaluation of patient's response to treatment, examination of patient, obtaining history from patient or surrogate,  ordering and performing treatments and interventions, ordering and review of laboratory studies, ordering and review of radiographic studies, pulse oximetry and re-evaluation of patient's condition.  ____________________________________________   INITIAL IMPRESSION / ASSESSMENT AND PLAN / ED COURSE  63 y.o. male with a history of a CVA, hypertension, hyperlipidemia who presents for evaluation of syncope.  This is patient's second episode in 3 days.  Patient is orthostatic positive in the emergency room.  Neurologically intact.  EKG showing new ST depressions with deep inverted T waves in the lateral leads.  Patient is denying any chest pain.  Differential diagnosis including orthostatic hypotension versus cardiac dysrhythmia versus vasovagal.  [facility] sync[facility] rule patient is not low risk.  Will check labs, monitor on telemetry.  Anticipate admission to the hospitalist service.  Will give IV fluids for orthostasis.  Clinical Course as of May 08 356  [facility]  [id number] High-sensitivity troponin elevated at 463 concerning for a potential ischemic event.  On further history patient reports that 3 days ago prior to the first syncopal event he had some left arm pain and lower back pain which he attributed to some yard work that he was doing.  At this time he has no back pain, chest pain, or any shortness of breath.  His potassium is also low at 2.9 which will be supplemented.  Spoke with Marylene LandAngela NP from the hospitalist service for admission   [CV]    Clinical Course User Index [CV] Don PerkingVeronese, [place], MD      As part of my medical decision making, I reviewed the following data within the electronic MEDICAL RECORD NUMBER Nursing notes reviewed and incorporated, Labs reviewed , EKG interpreted , Old EKG reviewed, Old chart reviewed, Radiograph reviewed , Discussed with admitting physician , Notes from prior ED visits and [place] Controlled Substance Database   Patient was evaluated in Emergency Department today for the symptoms described in the history of present illness. Patient was evaluated in the context of the global COVID-19 pandemic, which necessitated consideration that the patient might be at risk for infection with the SARS-CoV-2 virus that causes COVID-19. Institutional protocols and algorithms that pertain to the evaluation of patients at risk for COVID-19 are in a state of rapid change based on information released by regulatory bodies including the CDC and federal and state organizations. These policies and algorithms were followed during the patient's care in the ED.   ____________________________________________   FINAL CLINICAL IMPRESSION(S) / ED DIAGNOSES   Final diagnoses:  NSTEMI (non-ST elevated myocardial infarction)  (HCC)  Syncope, unspecified syncope type      NEW MEDICATIONS STARTED DURING THIS VISIT:  ED Discharge Orders    None       Note:  This document was prepared using Dragon voice recognition software and may include unintentional dictation errors.    Don PerkingVeronese, [place], MD 05/09/19 [phone]

## 2019-05-09 NOTE — ED Notes (Signed)
Lab called to report Troponin of 463; will notify Dr Alfred Levins

## 2019-05-09 NOTE — Progress Notes (Signed)
Patient ID: Tyler Tyler, male   DOB: 10/16/1955, 63 y.o.   MRN: [id number]  [employer] PROGRESS NOTE  Tyler Tyler OZH:[id number] DOB: 12/27/1955 DOA: 05/09/2019 PCP: Tyler PasseyLada, [name], MD  HPI/Subjective: Patient feeling better.  States on Saturday and Sunday got real weak usually happens in the evenings.  He needed help to even walk because his legs were weak.  In the ER his troponin was borderline and his potassium was low.  Objective: Vitals:   05/09/19 0528 05/09/19 0804  BP: 96/83 121/78  Pulse: 94 83  Resp: 20 19  Temp: 98.7 F (37.1 C) 100.2 F (37.9 C)  SpO2: 100% 95%    Filed Weights   05/09/19 0225 05/09/19 0528  Weight: 98.1 kg 90.7 kg    ROS: Review of Systems  Constitutional: Positive for fever and malaise/fatigue. Negative for chills.  Eyes: Negative for blurred vision.  Respiratory: Negative for cough and shortness of breath.   Cardiovascular: Negative for chest pain.  Gastrointestinal: Negative for abdominal pain, constipation, diarrhea, nausea and vomiting.  Genitourinary: Positive for dysuria.  Musculoskeletal: Negative for joint pain.  Neurological: Negative for dizziness and headaches.   Exam: Physical Exam  Constitutional: He is oriented to person, place, and time.  HENT:  Nose: No mucosal edema.  Mouth/Throat: No oropharyngeal exudate or posterior oropharyngeal edema.  Eyes: Pupils are equal, round, and reactive to light. Conjunctivae, EOM and lids are normal.  Neck: No JVD present. Carotid bruit is not present. No edema present. No thyroid mass and no thyromegaly present.  Cardiovascular: S1 normal and S2 normal. Exam reveals no gallop.  No murmur heard. Pulses:      Dorsalis pedis pulses are 2+ on the right side and 2+ on the left side.  Respiratory: No respiratory distress. He has no wheezes. He has no rhonchi. He has no rales.  GI: Soft. Bowel sounds are normal. There is no abdominal tenderness.  Musculoskeletal:     Right  ankle: He exhibits swelling.     Left ankle: He exhibits swelling.  Lymphadenopathy:    He has no cervical adenopathy.  Neurological: He is alert and oriented to person, place, and time. No cranial nerve deficit.  Skin: Skin is warm. No rash noted. Nails show no clubbing.  Psychiatric: He has a normal mood and affect.      Data Reviewed: Basic Metabolic Panel: Recent Labs  Lab 05/09/19 0234 05/09/19 0447  NA 133*  --   K 2.9*  --   CL 96*  --   CO2 24  --   GLUCOSE 118*  --   BUN 15  --   CREATININE 1.13  --   CALCIUM 8.9  --   MG  --  2.3   Liver Function Tests: Recent Labs  Lab 05/09/19 0234  AST 51*  ALT 36  ALKPHOS 47  BILITOT 1.4*  PROT 9.4*  ALBUMIN 3.9   CBC: Recent Labs  Lab 05/09/19 0234  WBC 11.3*  NEUTROABS 9.4*  HGB 13.6  HCT 41.4  MCV 86.6  PLT 289     Recent Results (from the past 240 hour(s))  SARS Coronavirus 2 [facility](Hospital order, [facility]) Nasopharyngeal Nasopharyngeal Swab     Status: None   Collection Time: 05/09/19  4:18 AM   Specimen: Nasopharyngeal Swab  Result Value Ref Range Status   SARS Coronavirus 2 NEGATIVE NEGATIVE Final    Comment: (NOTE) If result is NEGATIVE SARS-CoV-2 target nucleic acids are  NOT DETECTED. The SARS-CoV-2 RNA is generally detectable in upper and lower  respiratory specimens during the acute phase of infection. The lowest  concentration of SARS-CoV-2 viral copies this assay can detect is 250  copies / mL. A negative result does not preclude SARS-CoV-2 infection  and should not be used as the sole basis for treatment or other  patient management decisions.  A negative result may occur with  improper specimen collection / handling, submission of specimen other  than nasopharyngeal swab, presence of viral mutation(s) within the  areas targeted by this assay, and inadequate number of viral copies  (<250 copies / mL). A negative result must be combined with clinical   observations, patient history, and epidemiological information. If result is POSITIVE SARS-CoV-2 target nucleic acids are DETECTED. The SARS-CoV-2 RNA is generally detectable in upper and lower  respiratory specimens dur ing the acute phase of infection.  Positive  results are indicative of active infection with SARS-CoV-2.  Clinical  correlation with patient history and other diagnostic information is  necessary to determine patient infection status.  Positive results do  not rule out bacterial infection or co-infection with other viruses. If result is PRESUMPTIVE POSTIVE SARS-CoV-2 nucleic acids MAY BE PRESENT.   A presumptive positive result was obtained on the submitted specimen  and confirmed on repeat testing.  While 2019 novel coronavirus  (SARS-CoV-2) nucleic acids may be present in the submitted sample  additional confirmatory testing may be necessary for epidemiological  and / or clinical management purposes  to differentiate between  SARS-CoV-2 and other Sarbecovirus currently known to infect humans.  If clinically indicated additional testing with an alternate test  methodology [phone]) is advised. The SARS-CoV-2 RNA is generally  detectable in upper and lower respiratory sp ecimens during the acute  phase of infection. The expected result is Negative. Fact Sheet for Patients:  StrictlyIdeas.no Fact Sheet for Healthcare Providers: BankingDealers.co.za This test is not yet approved or cleared by the [place] FDA and has been authorized for detection and/or diagnosis of SARS-CoV-2 by FDA under an Emergency Use Authorization (EUA).  This EUA will remain in effect (meaning this test can be used) for the duration of the COVID-19 declaration under Section 564(b)(1) of the Act, 21 U.S.C. section 360bbb-3(b)(1), unless the authorization is terminated or revoked sooner. Performed at [facility], [address] [place], [place] [address]      Studies: Ct Head Wo Contrast  Result Date: 05/09/2019 CLINICAL DATA:  Acute onset altered mental status EXAM: CT HEAD WITHOUT CONTRAST TECHNIQUE: Contiguous axial images were obtained from the base of the skull through the vertex without intravenous contrast. COMPARISON:  Head CT and brain MRI October 3rd and 4th, 2018 FINDINGS: Brain: No evidence of acute infarction, hemorrhage, hydrocephalus, extra-axial collection or mass effect. Small remote anterior and parasagittal right frontal infarction. Bilobed high-density mass in the sella and right suprasellar cistern compatible with proteinaceous Rathke's cleft cyst, stable from prior bearing MRI. Vascular: No hyperdense vessel or unexpected calcification. Skull: Normal. Negative for fracture or focal lesion. Sinuses/Orbits: No acute finding. IMPRESSION: 1. No acute finding. 2. Small remote right ACA distribution infarct. 3. Known sellar and suprasellar Rathke's cleft cyst. Electronically Signed   By: Monte Fantasia M.D.   On: 05/09/2019 05:25    Scheduled Meds: . aspirin EC  81 mg Oral Daily  . atorvastatin  40 mg Oral q1800  . losartan  100 mg Oral Daily  . pantoprazole (PROTONIX) IV  40 mg  Intravenous Q24H  . potassium chloride SA  20 mEq Oral Daily  . sodium chloride flush  3 mL Intravenous Q12H   Continuous Infusions: . cefTRIAXone (ROCEPHIN)  IV    . heparin 1,200 Units/hr (05/09/19 0557)    Assessment/Plan:  1. Hypokalemia and weakness.  Recheck potassium.  Magnesium normal range.  Discontinue hydrochlorothiazide and continue losartan.  Replace if low. 2. Syncope with collapse.  Stress test echocardiogram and carotid ultrasound currently pending.  Will check orthostatic vital signs. 3. Low-grade fever and positive urinalysis.  Send off urine culture and start Rocephin IV. 4. Elevated troponin likely demand ischemia.  Stress test currently pending patient on aspirin and heparin drip. 5. Hyperlipidemia  unspecified on atorvastatin  Code Status:     Code Status Orders  (From admission, onward)         Start     Ordered   05/09/19 0421  Full code  Continuous     05/09/19 0420        Code Status History    Date Active Date Inactive Code Status Order ID Comments User Context   07/08/2017 1908 07/10/2017 2153 Full Code [id number]  Camelia PhenesHoffman, Jessica Ratliff, DO Inpatient   07/05/2017 1844 07/06/2017 2031 Full Code [id number]  Camelia PhenesHoffman, Jessica Ratliff, DO Inpatient   Advance Care Planning Activity    Advance Directive Documentation     Most Recent Value  Type of Advance Directive  Healthcare Power of Attorney, Living will  Pre-existing out of facility DNR order (yellow form or pink MOST form)  -  "MOST" Form in Place?  -     Family Communication: Wife at the bedside Disposition Plan: Hopefully discharge tomorrow  Consultants:  Cardiology  Antibiotics:  Rocephin  Time spent: 36 minutes  Kristain Hu [employer]  [employer]

## 2019-05-10 DIAGNOSIS — J181 Lobar pneumonia, unspecified organism: Secondary | ICD-10-CM

## 2019-05-10 DIAGNOSIS — R55 Syncope and collapse: Secondary | ICD-10-CM

## 2019-05-10 DIAGNOSIS — R7989 Other specified abnormal findings of blood chemistry: Secondary | ICD-10-CM

## 2019-05-10 LAB — CBC
HCT: 36.2 % — ABNORMAL LOW (ref 39.0–52.0)
Hemoglobin: 11.8 g/dL — ABNORMAL LOW (ref 13.0–17.0)
MCH: 28.4 pg (ref 26.0–34.0)
MCHC: 32.6 g/dL (ref 30.0–36.0)
MCV: 87.2 fL (ref 80.0–100.0)
Platelets: 282 10*3/uL (ref 150–400)
RBC: 4.15 MIL/uL — ABNORMAL LOW (ref 4.22–5.81)
RDW: 12 % (ref 11.5–15.5)
WBC: 9.4 10*3/uL (ref 4.0–10.5)
nRBC: 0 % (ref 0.0–0.2)

## 2019-05-10 LAB — BASIC METABOLIC PANEL
Anion gap: 8 (ref 5–15)
BUN: 11 mg/dL (ref 8–23)
CO2: 22 mmol/L (ref 22–32)
Calcium: 8.5 mg/dL — ABNORMAL LOW (ref 8.9–10.3)
Chloride: 108 mmol/L (ref 98–111)
Creatinine, Ser: 0.96 mg/dL (ref 0.61–1.24)
GFR calc Af Amer: >60 mL/min (ref >60)
GFR calc non Af Amer: >60 mL/min (ref >60)
Glucose, Bld: 114 mg/dL — ABNORMAL HIGH (ref 70–99)
Potassium: 3.4 mmol/L — ABNORMAL LOW (ref 3.5–5.1)
Sodium: 138 mmol/L (ref 135–145)

## 2019-05-10 LAB — HEPARIN LEVEL (UNFRACTIONATED): Heparin Unfractionated: 0.45 IU/mL (ref 0.30–0.70)

## 2019-05-10 LAB — HIV ANTIBODY (ROUTINE TESTING W REFLEX): HIV Screen 4th Generation wRfx: NONREACTIVE

## 2019-05-10 LAB — NM MYOCAR MULTI W/SPECT W/WALL MOTION / EF
LV dias vol: 115 mL (ref 62–150)
LV sys vol: 38 mL
Peak HR: 102 {beats}/min
Percent HR: 64 %
Rest HR: 86 {beats}/min
SDS: 0
SRS: 2
SSS: 0
TID: 1.06

## 2019-05-10 MED ORDER — SODIUM CHLORIDE 0.9 % IV SOLN
2.0000 g | Freq: Every day | INTRAVENOUS | Status: DC
  Administered 2019-05-10: 2 g via INTRAVENOUS
  Filled 2019-05-10: qty 20
  Filled 2019-05-10: qty 2

## 2019-05-10 MED ORDER — ENOXAPARIN SODIUM 40 MG/0.4ML ~~LOC~~ SOLN: 40.0000 mg | SUBCUTANEOUS | Status: DC

## 2019-05-10 MED ORDER — AZITHROMYCIN 250 MG PO TABS
500.0000 mg | ORAL_TABLET | Freq: Every day | ORAL | Status: AC
  Administered 2019-05-10: 09:00:00 500 mg via ORAL
  Filled 2019-05-10: qty 2

## 2019-05-10 MED ORDER — AZITHROMYCIN 250 MG PO TABS
250.0000 mg | ORAL_TABLET | Freq: Every day | ORAL | Status: DC
  Administered 2019-05-11: 250 mg via ORAL
  Filled 2019-05-10: qty 1

## 2019-05-10 NOTE — Plan of Care (Signed)
  Problem: Education: Goal: Knowledge of General Education information will improve Description: Including pain rating scale, medication(s)/side effects and non-pharmacologic comfort measures Outcome: Progressing   Problem: Health Behavior/Discharge Planning: Goal: Ability to manage health-related needs will improve Outcome: Progressing   Problem: Clinical Measurements: Goal: Respiratory complications will improve Outcome: Progressing Goal: Cardiovascular complication will be avoided Outcome: Progressing   Problem: Safety: Goal: Ability to remain free from injury will improve Outcome: Progressing   Problem: Activity: Goal: Ability to tolerate increased activity will improve Outcome: Progressing Note: Orthostatics have improved   Problem: Pain Managment: Goal: General experience of comfort will improve Outcome: Completed/Met

## 2019-05-10 NOTE — Progress Notes (Signed)
Patient ID: Tyler Gomez, male   DOB: 02/25/1956, 63 y.o.   MRN: [id number]  [facility] PROGRESS NOTE  Tyler MccreedyMyron F Gomez WUJ:[id number] DOB: 03/11/1956 DOA: 05/09/2019 PCP: Kerman PasseyLada, [name], MD  HPI/Subjective: Patient feeling much better.  Patient wanted to go home yesterday and of course wants to go home today.  Patient was severely orthostatic last night and spiked a temperature of 103.  Patient states that he feels a lot better than yesterday.  Denies most symptoms.  Objective: Vitals:   05/10/19 0804 05/10/19 0810  BP: 121/87 110/74  Pulse: (!) 117 (!) 112  Resp:    Temp:    SpO2: 97% 99%    Filed Weights   05/09/19 0225 05/09/19 0528  Weight: 98.1 kg 90.7 kg    ROS: Review of Systems  Constitutional: Positive for fever. Negative for chills and malaise/fatigue.  Eyes: Negative for blurred vision.  Respiratory: Negative for cough and shortness of breath.   Cardiovascular: Negative for chest pain.  Gastrointestinal: Negative for abdominal pain, constipation, diarrhea, nausea and vomiting.  Genitourinary: Negative for dysuria.  Musculoskeletal: Negative for joint pain.  Neurological: Negative for dizziness and headaches.   Exam: Physical Exam  Constitutional: He is oriented to person, place, and time.  HENT:  Nose: No mucosal edema.  Mouth/Throat: No oropharyngeal exudate or posterior oropharyngeal edema.  Eyes: Pupils are equal, round, and reactive to light. Conjunctivae, EOM and lids are normal.  Neck: No JVD present. Carotid bruit is not present. No edema present. No thyroid mass and no thyromegaly present.  Cardiovascular: S1 normal and S2 normal. Exam reveals no gallop.  No murmur heard. Pulses:      Dorsalis pedis pulses are 2+ on the right side and 2+ on the left side.  Respiratory: No respiratory distress. He has no wheezes. He has no rhonchi. He has no rales.  GI: Soft. Bowel sounds are normal. There is no abdominal tenderness.  Musculoskeletal:   Right ankle: He exhibits swelling.     Left ankle: He exhibits swelling.  Lymphadenopathy:    He has no cervical adenopathy.  Neurological: He is alert and oriented to person, place, and time. No cranial nerve deficit.  Skin: Skin is warm. No rash noted. Nails show no clubbing.  Psychiatric: He has a normal mood and affect.      Data Reviewed: Basic Metabolic Panel: Recent Labs  Lab 05/09/19 0234 05/09/19 0447 05/09/19 1336 05/10/19 0538  NA 133*  --   --  138  K 2.9*  --  3.2* 3.4*  CL 96*  --   --  108  CO2 24  --   --  22  GLUCOSE 118*  --   --  114*  BUN 15  --   --  11  CREATININE 1.13  --   --  0.96  CALCIUM 8.9  --   --  8.5*  MG  --  2.3  --   --    Liver Function Tests: Recent Labs  Lab 05/09/19 0234  AST 51*  ALT 36  ALKPHOS 47  BILITOT 1.4*  PROT 9.4*  ALBUMIN 3.9   CBC: Recent Labs  Lab 05/09/19 0234 05/10/19 0538  WBC 11.3* 9.4  NEUTROABS 9.4*  --   HGB 13.6 11.8*  HCT 41.4 36.2*  MCV 86.6 87.2  PLT 289 282     Recent Results (from the past 240 hour(s))  SARS Coronavirus 2 [facility](Hospital order, Performed [facility]) Nasopharyngeal Nasopharyngeal  Swab     Status: None   Collection Time: 05/09/19  4:18 AM   Specimen: Nasopharyngeal Swab  Result Value Ref Range Status   SARS Coronavirus 2 NEGATIVE NEGATIVE Final    Comment: (NOTE) If result is NEGATIVE SARS-CoV-2 target nucleic acids are NOT DETECTED. The SARS-CoV-2 RNA is generally detectable in upper and lower  respiratory specimens during the acute phase of infection. The lowest  concentration of SARS-CoV-2 viral copies this assay can detect is 250  copies / mL. A negative result does not preclude SARS-CoV-2 infection  and should not be used as the sole basis for treatment or other  patient management decisions.  A negative result may occur with  improper specimen collection / handling, submission of specimen other  than nasopharyngeal swab, presence of viral mutation(s)  within the  areas targeted by this assay, and inadequate number of viral copies  (<250 copies / mL). A negative result must be combined with clinical  observations, patient history, and epidemiological information. If result is POSITIVE SARS-CoV-2 target nucleic acids are DETECTED. The SARS-CoV-2 RNA is generally detectable in upper and lower  respiratory specimens dur ing the acute phase of infection.  Positive  results are indicative of active infection with SARS-CoV-2.  Clinical  correlation with patient history and other diagnostic information is  necessary to determine patient infection status.  Positive results do  not rule out bacterial infection or co-infection with other viruses. If result is PRESUMPTIVE POSTIVE SARS-CoV-2 nucleic acids MAY BE PRESENT.   A presumptive positive result was obtained on the submitted specimen  and confirmed on repeat testing.  While 2019 novel coronavirus  (SARS-CoV-2) nucleic acids may be present in the submitted sample  additional confirmatory testing may be necessary for epidemiological  and / or clinical management purposes  to differentiate between  SARS-CoV-2 and other Sarbecovirus currently known to infect humans.  If clinically indicated additional testing with an alternate test  methodology [id number]) is advised. The SARS-CoV-2 RNA is generally  detectable in upper and lower respiratory sp ecimens during the acute  phase of infection. The expected result is Negative. Fact Sheet for Patients:  StrictlyIdeas.no Fact Sheet for Healthcare Providers: BankingDealers.co.za This test is not yet approved or cleared by the [place] FDA and has been authorized for detection and/or diagnosis of SARS-CoV-2 by FDA under an Emergency Use Authorization (EUA).  This EUA will remain in effect (meaning this test can be used) for the duration of the COVID-19 declaration under Section 564(b)(1) of the Act,  21 U.S.C. section 360bbb-3(b)(1), unless the authorization is terminated or revoked sooner. Performed at [facility], [address] [place], [place] [address]      Studies: Dg Chest 2 View  Result Date: 05/09/2019 CLINICAL DATA:  Fever.  MRI. EXAM: CHEST - 2 VIEW COMPARISON:  Chest x-ray report 07/08/2017. FINDINGS: Mediastinum hilar structures normal. Cardiac monitoring device is noted. Heart size normal. No focal infiltrate. No pleural effusion or pneumothorax. Degenerative change thoracic spine. IMPRESSION: No acute cardiopulmonary disease. Electronically Signed   By: Marcello Moores  Register   On: 05/09/2019 14:24   Ct Head Wo Contrast  Result Date: 05/09/2019 CLINICAL DATA:  Acute onset altered mental status EXAM: CT HEAD WITHOUT CONTRAST TECHNIQUE: Contiguous axial images were obtained from the base of the skull through the vertex without intravenous contrast. COMPARISON:  Head CT and brain MRI October 3rd and 4th, 2018 FINDINGS: Brain: No evidence of acute infarction, hemorrhage, hydrocephalus, extra-axial collection or mass effect. Small  remote anterior and parasagittal right frontal infarction. Bilobed high-density mass in the sella and right suprasellar cistern compatible with proteinaceous Rathke's cleft cyst, stable from prior bearing MRI. Vascular: No hyperdense vessel or unexpected calcification. Skull: Normal. Negative for fracture or focal lesion. Sinuses/Orbits: No acute finding. IMPRESSION: 1. No acute finding. 2. Small remote right ACA distribution infarct. 3. Known sellar and suprasellar Rathke's cleft cyst. Electronically Signed   By: Marnee SpringJonathon  Watts M.D.   On: 05/09/2019 05:25   [place] Carotid Bilateral  Result Date: 05/09/2019 CLINICAL DATA:  Syncope.  Left-sided weakness. EXAM: BILATERAL CAROTID DUPLEX ULTRASOUND TECHNIQUE: Wallace CullensGray scale imaging, color Doppler and duplex ultrasound were performed of bilateral carotid and vertebral arteries in the neck. COMPARISON:  Head CT  05/09/2019 FINDINGS: Criteria: Quantification of carotid stenosis is based on velocity parameters that correlate the residual internal carotid diameter with NASCET-based stenosis levels, using the diameter of the distal internal carotid lumen as the denominator for stenosis measurement. The following velocity measurements were obtained: RIGHT ICA: 91/36 cm/sec CCA: 97/25 cm/sec SYSTOLIC ICA/CCA RATIO:  0.9 ECA: 149 cm/sec LEFT ICA: 91/30 cm/sec CCA: 107/25 cm/sec SYSTOLIC ICA/CCA RATIO:  0.8 ECA: 166 cm/sec RIGHT CAROTID ARTERY: Intimal thickening in the right common carotid artery. Intimal thickening at the right carotid bulb. External carotid artery is patent with normal waveform. Intimal thickening or mild plaque in the internal carotid artery. Normal waveforms and velocities in the internal carotid artery. RIGHT VERTEBRAL ARTERY: Antegrade flow and normal waveform in the right vertebral artery. LEFT CAROTID ARTERY: Intimal thickening and minimal plaque involving the left common carotid artery and left carotid bulb. External carotid artery is patent with normal waveform. Small amount of echogenic plaque in the proximal internal carotid artery. Normal waveforms and velocities in the internal carotid artery. LEFT VERTEBRAL ARTERY: Antegrade flow and normal waveform in the left vertebral artery. 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 No significant stenosis. Estimated degree of stenosis in the internal carotid arteries is less than 50% bilaterally. Patent vertebral arteries with antegrade flow. Electronically Signed   By: Richarda OverlieAdam  Henn M.D.   On: 05/09/2019 11:16   Nm Myocar Multi W/spect W/wall Motion / Ef  Result Date: 05/10/2019  T wave inversion was noted during stress in the II, III, aVF, V3, V4, V5 and V6 leads.  There was no ST segment deviation noted during stress.  The study is normal.  This is a low risk study.  The left ventricular ejection  fraction is normal (55-65%).     Scheduled Meds: . aspirin EC  81 mg Oral Daily  . atorvastatin  40 mg Oral q1800  . [START ON 05/11/2019] azithromycin  250 mg Oral Daily  . pantoprazole (PROTONIX) IV  40 mg Intravenous Q24H  . potassium chloride SA  20 mEq Oral Daily  . sodium chloride flush  3 mL Intravenous Q12H   Continuous Infusions: . sodium chloride Stopped (05/09/19 1903)  . 0.9 % NaCl with KCl 20 mEq / L 50 mL/hr at 05/10/19 1357  . cefTRIAXone (ROCEPHIN)  IV      Assessment/Plan:  1. Pneumonia seen with stress test.  Chest x-ray negative.  Patient started on Rocephin last night and Zithromax this morning.  COVID-19 testing negative on admission.  If tonight he spikes a high fever can consider repeat COVID testing. 2. Orthostatic hypotension.  Discontinue all hypertensive medications.  IV fluid hydration  3. hypokalemia and weakness.  Continue oral potassium supplementation and IV potassium  supplementation.  Potassium better today than yesterday 4. Syncope with collapse.  Stress test negative and loop recording reviewed by cardiology and no arrhythmia. 5. Elevated troponin likely demand ischemia.  Stress test negative.  Stop heparin drip.  Continue aspirin and statin 6. Hyperlipidemia unspecified on atorvastatin  Code Status:     Code Status Orders  (From admission, onward)         Start     Ordered   05/09/19 0421  Full code  Continuous     05/09/19 0420        Code Status History    Date Active Date Inactive Code Status Order ID Comments User Context   07/08/2017 1908 07/10/2017 2153 Full Code [id number]  Camelia PhenesHoffman, Jessica Ratliff, DO Inpatient   07/05/2017 1844 07/06/2017 2031 Full Code [id number]  Camelia PhenesHoffman, Jessica Ratliff, DO Inpatient   Advance Care Planning Activity    Advance Directive Documentation     Most Recent Value  Type of Advance Directive  Healthcare Power of Attorney, Living will  Pre-existing out of facility DNR order (yellow form or pink MOST form)   -  "MOST" Form in Place?  -     Family Communication: Wife at the bedside Disposition Plan: Will reevaluate tomorrow morning.  Patient not happy with the plan but wife is agreeable to plan.  Consultants:  Cardiology  Antibiotics:  Rocephin  Zithromax  Time spent: 32 minutes  [employer]  [employer]

## 2019-05-10 NOTE — Progress Notes (Signed)
Tyler Gomez for heparin Indication: chest pain/ACS  No Known Allergies  Patient Measurements: Height: 6' (182.9 cm) Weight: 199 lb 14.4 oz (90.7 kg) IBW/kg (Calculated) : 77.6 Heparin Dosing Weight: 85.8 kg  Vital Signs: Temp: 98.6 F (37 C) (08/04 0412) Temp Source: Oral (08/03 2213) BP: 119/73 (08/04 0412) Pulse Rate: 89 (08/04 0412)  Labs: Recent Labs    05/09/19 0234 05/09/19 0447 05/09/19 0639 05/09/19 1336 05/09/19 1919 05/10/19 0538  HGB 13.6  --   --   --   --  11.8*  HCT 41.4  --   --   --   --  36.2*  PLT 289  --   --   --   --  282  APTT  --  28  --   --   --   --   LABPROT  --  15.2  --   --   --   --   INR  --  1.2  --   --   --   --   HEPARINUNFRC  --   --   --  0.31 0.44 0.45  CREATININE 1.13  --   --   --   --   --   CKTOTAL  --   --  216  --   --   --   TROPONINIHS 463* 849* 796*  --   --   --     Estimated Creatinine Clearance: 73.4 mL/min (by C-G formula based on SCr of 1.13 mg/dL).   Medical History: Past Medical History:  Diagnosis Date  . 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 Hypertension   . Stroke (cerebrum) ([place])   . Syncope     Medications:  Scheduled:  . aspirin EC  81 mg Oral Daily  . atorvastatin  40 mg Oral q1800  . losartan  100 mg Oral Daily  . pantoprazole (PROTONIX) IV  40 mg Intravenous Q24H  . potassium chloride SA  20 mEq Oral Daily  . sodium chloride flush  3 mL Intravenous Q12H   Troponin 463 >> 849 >> 796  Assessment: Patient admitted tonight for syncope were he was going to bathroom and supposedly woke up to his wife freaking out, patient LOC and this is the second episode and happens at night while going to the bathroom. Upon arrival EKG shows some ST depression in leads V5 w/ repolarization abnormalities suggesting ischemia,   Patient is not on any anticoagulation PTA.  Patient is being started on heparin drip for probably NSTEMI.  8/3 HL @1336 : 0.31.  Therapeutic. No interupptions noted by nurse. Of note, HL was supposed to be drawn at 1100 today, but phlebotomist was unable to obtain the lab at that time due to a test being conducted in patient's room.    Goal of Therapy:  Heparin level 0.3-0.7 units/ml Monitor platelets by anticoagulation protocol: Yes   Plan:  8/4 0538 HL = 0.45, therapeutic. Continue rate at 1200 units/hr. Will check anti-Xa with am labs Will continue to monitor daily CBC's and adjust per anti-Xa levels.  Hart Robinsons, PharmD Clinical Pharmacist 05/10/2019

## 2019-05-10 NOTE — Progress Notes (Signed)
[facility]  Date: 05/10/19 Time: 10:56 AM  Implantable loop recorder interrogated in person.  No events recorded since last interrogation or implantation.  Recent syncopal event was not caused by a significant cardiac arrhythmia.  Nelva Bush, MD [facility] Pager: [phone]

## 2019-05-10 NOTE — Progress Notes (Signed)
Progress Note  Patient Name: Tyler Gomez Date of Encounter: 05/10/2019  Primary Cardiologist: Lovena Le  Subjective   Feels well this morning.  No chest pain, shortness of breath, or lightheadedness.  Fever noted overnight, though patient attributes this to being under several blankets.  He did not have subjective fevers or chills.  Inpatient Medications    Scheduled Meds:  aspirin EC  81 mg Oral Daily   atorvastatin  40 mg Oral q1800   azithromycin  500 mg Oral Daily   Followed by   Derrill Memo ON 05/11/2019] azithromycin  250 mg Oral Daily   pantoprazole (PROTONIX) IV  40 mg Intravenous Q24H   potassium chloride SA  20 mEq Oral Daily   sodium chloride flush  3 mL Intravenous Q12H   Continuous Infusions:  sodium chloride Stopped (05/09/19 1903)   0.9 % NaCl with KCl 20 mEq / L 50 mL/hr at 05/09/19 1758   cefTRIAXone (ROCEPHIN)  IV Stopped (05/09/19 1840)   heparin 1,200 Units/hr (05/09/19 2212)   PRN Meds: sodium chloride, acetaminophen, ondansetron (ZOFRAN) IV, sodium chloride flush   Vital Signs    Vitals:   05/10/19 0800 05/10/19 0802 05/10/19 0804 05/10/19 0810  BP: 129/76 121/87 121/87 110/74  Pulse: 91 (!) 108 (!) 117 (!) 112  Resp: 18     Temp: 99.5 F (37.5 C)     TempSrc: Oral     SpO2: 98% 100% 97% 99%  Weight:      Height:        Intake/Output Summary (Last 24 hours) at 05/10/2019 0845 Last data filed at 05/10/2019 0731 Gross per 24 hour  Intake 909 ml  Output 1300 ml  Net -391 ml   Last 3 Weights 05/09/2019 05/09/2019 12/29/2017  Weight (lbs) 199 lb 14.4 oz 216 lb 4.3 oz 200 lb 1.6 oz  Weight (kg) 90.674 kg 98.1 kg 90.765 kg      Telemetry    Sinus rhythm- Personally Reviewed  ECG    No new tracing  Physical Exam   GEN: No acute distress.   Neck: No JVD Cardiac: RRR, no murmurs, rubs, or gallops.  Respiratory:  Faint crackles at the right lung base.  Normal work of breathing.  No wheezes. GI: Soft, nontender, non-distended  MS:  No edema; No deformity. Neuro:  Nonfocal  Psych: Normal affect   Labs    High Sensitivity Troponin:   Recent Labs  Lab 05/09/19 0234 05/09/19 0447 05/09/19 0639  TROPONINIHS 463* 849* 796*      Cardiac EnzymesNo results for input(s): TROPONINI in the last 168 hours. No results for input(s): TROPIPOC in the last 168 hours.   Chemistry Recent Labs  Lab 05/09/19 0234 05/09/19 1336 05/10/19 0538  NA 133*  --  138  K 2.9* 3.2* 3.4*  CL 96*  --  108  CO2 24  --  22  GLUCOSE 118*  --  114*  BUN 15  --  11  CREATININE 1.13  --  0.96  CALCIUM 8.9  --  8.5*  PROT 9.4*  --   --   ALBUMIN 3.9  --   --   AST 51*  --   --   ALT 36  --   --   ALKPHOS 47  --   --   BILITOT 1.4*  --   --   GFRNONAA >60  --  >60  GFRAA >60  --  >60  ANIONGAP 13  --  8  Hematology Recent Labs  Lab 05/09/19 0234 05/10/19 0538  WBC 11.3* 9.4  RBC 4.78 4.15*  HGB 13.6 11.8*  HCT 41.4 36.2*  MCV 86.6 87.2  MCH 28.5 28.4  MCHC 32.9 32.6  RDW 11.9 12.0  PLT 289 282    BNPNo results for input(s): BNP, PROBNP in the last 168 hours.   DDimer No results for input(s): DDIMER in the last 168 hours.   Radiology    Dg Chest 2 View  Result Date: 05/09/2019 CLINICAL DATA:  Fever.  MRI. EXAM: CHEST - 2 VIEW COMPARISON:  Chest x-ray report 07/08/2017. FINDINGS: Mediastinum hilar structures normal. Cardiac monitoring device is noted. Heart size normal. No focal infiltrate. No pleural effusion or pneumothorax. Degenerative change thoracic spine. IMPRESSION: No acute cardiopulmonary disease. Electronically Signed   By: Maisie Fushomas  Register   On: 05/09/2019 14:24   Ct Head Wo Contrast  Result Date: 05/09/2019 CLINICAL DATA:  Acute onset altered mental status EXAM: CT HEAD WITHOUT CONTRAST TECHNIQUE: Contiguous axial images were obtained from the base of the skull through the vertex without intravenous contrast. COMPARISON:  Head CT and brain MRI October 3rd and 4th, 2018 FINDINGS: Brain: No evidence of  acute infarction, hemorrhage, hydrocephalus, extra-axial collection or mass effect. Small remote anterior and parasagittal right frontal infarction. Bilobed high-density mass in the sella and right suprasellar cistern compatible with proteinaceous Rathke's cleft cyst, stable from prior bearing MRI. Vascular: No hyperdense vessel or unexpected calcification. Skull: Normal. Negative for fracture or focal lesion. Sinuses/Orbits: No acute finding. IMPRESSION: 1. No acute finding. 2. Small remote right ACA distribution infarct. 3. Known sellar and suprasellar Rathke's cleft cyst. Electronically Signed   By: Marnee SpringJonathon  Watts M.D.   On: 05/09/2019 05:25   [place] Carotid Bilateral  Result Date: 05/09/2019 CLINICAL DATA:  Syncope.  Left-sided weakness. EXAM: BILATERAL CAROTID DUPLEX ULTRASOUND TECHNIQUE: Wallace CullensGray scale imaging, color Doppler and duplex ultrasound were performed of bilateral carotid and vertebral arteries in the neck. COMPARISON:  Head CT 05/09/2019 FINDINGS: Criteria: Quantification of carotid stenosis is based on velocity parameters that correlate the residual internal carotid diameter with NASCET-based stenosis levels, using the diameter of the distal internal carotid lumen as the denominator for stenosis measurement. The following velocity measurements were obtained: RIGHT ICA: 91/36 cm/sec CCA: 97/25 cm/sec SYSTOLIC ICA/CCA RATIO:  0.9 ECA: 149 cm/sec LEFT ICA: 91/30 cm/sec CCA: 107/25 cm/sec SYSTOLIC ICA/CCA RATIO:  0.8 ECA: 166 cm/sec RIGHT CAROTID ARTERY: Intimal thickening in the right common carotid artery. Intimal thickening at the right carotid bulb. External carotid artery is patent with normal waveform. Intimal thickening or mild plaque in the internal carotid artery. Normal waveforms and velocities in the internal carotid artery. RIGHT VERTEBRAL ARTERY: Antegrade flow and normal waveform in the right vertebral artery. LEFT CAROTID ARTERY: Intimal thickening and minimal plaque involving the left  common carotid artery and left carotid bulb. External carotid artery is patent with normal waveform. Small amount of echogenic plaque in the proximal internal carotid artery. Normal waveforms and velocities in the internal carotid artery. LEFT VERTEBRAL ARTERY: Antegrade flow and normal waveform in the left vertebral artery. 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 No significant stenosis. Estimated degree of stenosis in the internal carotid arteries is less than 50% bilaterally. Patent vertebral arteries with antegrade flow. Electronically Signed   By: Richarda OverlieAdam  Henn M.D.   On: 05/09/2019 11:16    Cardiac Studies   TTE (05/09/2019):  1. The left ventricle has normal systolic  function, with an ejection fraction of 55-60%. The cavity size was normal. There is mild concentric left ventricular hypertrophy. Left ventricular diastolic Doppler parameters are consistent with impaired  relaxation.  2. The right ventricle has normal systolic function. The cavity was normal. There is no increase in right ventricular wall thickness. Right ventricular systolic pressure could not be assessed.  3. The mitral valve is grossly normal.  4. The tricuspid valve is grossly normal.  5. The aortic valve is tricuspid. Mild calcification of the aortic valve. No stenosis of the aortic valve.  6. The aorta is normal in size and structure.  Pharmacologic MPI (05/09/2019): Results pending.  Patient Profile     63 y.o. male with a hx of cryptogenic stroke in 06/2017, syncope in 07/2017 dating back to > 10 years prior who is being seen today for the evaluation of elevated troponin and syncope.  Assessment & Plan    Syncope: Most likely vasovagal, potentially exacerbated by intravascular volume depletion in the setting of febrile illness.  Orthostatic hypotension noted overnight.  Patient feels well this morning.  Echocardiogram with preserved LVEF.  No significant valvular  abnormality.  Continue hydration.  Will interrogate loop recorder, though my suspicion for arrhythmogenic cause of syncope is low.  Treat underlying infectious process.  Elevated troponin: No chest pain reported before or during hospitalization.  Final results for myocardial perfusion stress test are still pending, though on my review, I do not see evidence of significant ischemia or scar.  Suspect troponin elevation reflects supply-demand mismatch.  Await final results of myocardial perfusion stress test.  Favor discontinuation of heparin infusion.  Continue aspirin and statin therapy.  Community-acquired pneumonia: Patient febrile overnight and noted to have mild leukocytosis on admission.  Attenuation correction CT from yesterday stress test shows opacity in the right lung base (on my review, I favor pneumonia over mass).  Empiric treatment of possible community-acquired pneumonia per internal medicine.  Recommend formal CT of the chest in a few weeks to ensure resolution     For questions or updates, please contact [facility] Please consult [email or web address] for contact info under [facility] cardiology.     Signed, Yvonne Kendallhristopher Rejoice Heatwole, MD  05/10/2019, 8:45 AM

## 2019-05-11 DIAGNOSIS — R778 Other specified abnormalities of plasma proteins: Secondary | ICD-10-CM

## 2019-05-11 LAB — BASIC METABOLIC PANEL
Anion gap: 9 (ref 5–15)
BUN: 10 mg/dL (ref 8–23)
CO2: 23 mmol/L (ref 22–32)
Calcium: 8.5 mg/dL — ABNORMAL LOW (ref 8.9–10.3)
Chloride: 109 mmol/L (ref 98–111)
Creatinine, Ser: 0.86 mg/dL (ref 0.61–1.24)
GFR calc Af Amer: >60 mL/min (ref >60)
GFR calc non Af Amer: >60 mL/min (ref >60)
Glucose, Bld: 106 mg/dL — ABNORMAL HIGH (ref 70–99)
Potassium: 3.7 mmol/L (ref 3.5–5.1)
Sodium: 141 mmol/L (ref 135–145)

## 2019-05-11 LAB — CBC
HCT: 37.3 % — ABNORMAL LOW (ref 39.0–52.0)
Hemoglobin: 11.7 g/dL — ABNORMAL LOW (ref 13.0–17.0)
MCH: 28.1 pg (ref 26.0–34.0)
MCHC: 31.4 g/dL (ref 30.0–36.0)
MCV: 89.4 fL (ref 80.0–100.0)
Platelets: 289 10*3/uL (ref 150–400)
RBC: 4.17 MIL/uL — ABNORMAL LOW (ref 4.22–5.81)
RDW: 12.1 % (ref 11.5–15.5)
WBC: 8.5 10*3/uL (ref 4.0–10.5)
nRBC: 0 % (ref 0.0–0.2)

## 2019-05-11 MED ORDER — AZITHROMYCIN 250 MG PO TABS: ORAL_TABLET | ORAL | 0 refills | Status: AC

## 2019-05-11 MED ORDER — CEFDINIR 300 MG PO CAPS: 300.0000 mg | ORAL_CAPSULE | Freq: Two times a day (BID) | ORAL | 0 refills | Status: AC

## 2019-05-11 MED ORDER — ASPIRIN 81 MG PO TBEC: 81.0000 mg | DELAYED_RELEASE_TABLET | Freq: Every day | ORAL | 0 refills | Status: AC

## 2019-05-11 NOTE — Progress Notes (Signed)
Pt to be discharged today. Iv 's and tele removed. disch instructions and prescrips given disch via w.c. to awaiting family car.

## 2019-05-11 NOTE — Discharge Instructions (Signed)
Recommend CT chest in 4-6 weeks to ensure clearing of pneumonia

## 2019-05-11 NOTE — Progress Notes (Signed)
Progress Note  Patient Name: Tyler MccreedyMyron F Gomez Date of Encounter: 05/11/2019  [facility] ([facility] Dr[facility] Ladona Ridgelaylor)  Subjective   Patient continues to feel well this morning.  He denies chest pain, shortness of breath, or symptoms of presyncope.  He ambulated in the hallway yesterday without any difficulty.  Inpatient Medications    Scheduled Meds: . aspirin EC  81 mg Oral Daily  . atorvastatin  40 mg Oral q1800  . azithromycin  250 mg Oral Daily  . enoxaparin (LOVENOX) injection  40 mg Subcutaneous Q24H  . potassium chloride SA  20 mEq Oral Daily  . sodium chloride flush  3 mL Intravenous Q12H   Continuous Infusions: . sodium chloride Stopped (05/09/19 1903)  . cefTRIAXone (ROCEPHIN)  IV 2 g (05/10/19 1617)   PRN Meds: sodium chloride, acetaminophen, ondansetron (ZOFRAN) IV, sodium chloride flush   Vital Signs    Vitals:   05/10/19 2025 05/10/19 2148 05/11/19 0406 05/11/19 0814  BP: 100/66  127/81 123/77  Pulse: (!) 124  70 75  Resp:    18  Temp:  98.6 F (37 C) 98.2 F (36.8 C) 99.3 F (37.4 C)  TempSrc:  Oral Oral Oral  SpO2: 97%  100% 100%  Weight:   89.9 kg   Height:        Intake/Output Summary (Last 24 hours) at 05/11/2019 0913 Last data filed at 05/11/2019 0700 Gross per 24 hour  Intake 1892.57 ml  Output 500 ml  Net 1392.57 ml   Last 3 Weights 05/11/2019 05/09/2019 05/09/2019  Weight (lbs) 198 lb 3.2 oz 199 lb 14.4 oz 216 lb 4.3 oz  Weight (kg) 89.903 kg 90.674 kg 98.1 kg      Telemetry    Sinus rhythm, 80s to 90s- Personally Reviewed  ECG    No new tracings- Personally Reviewed  Physical Exam   GEN: No acute distress.  Lying comfortably in bed. Neck: No JVD Cardiac: RRR, no murmurs, rubs, or gallops.  Respiratory:  Trace crackles at the right base but otherwise clear to auscultation bilaterally.  Normal inspiratory effort. GI: Soft, nontender, non-distended  MS: No edema; No deformity. Neuro:  Nonfocal  Psych: Normal affect    Labs    High Sensitivity Troponin:   Recent Labs  Lab 05/09/19 0234 05/09/19 0447 05/09/19 0639  TROPONINIHS 463* 849* 796*      Cardiac EnzymesNo results for input(s): TROPONINI in the last 168 hours. No results for input(s): TROPIPOC in the last 168 hours.   Chemistry Recent Labs  Lab 05/09/19 0234 05/09/19 1336 05/10/19 0538 05/11/19 0515  NA 133*  --  138 141  K 2.9* 3.2* 3.4* 3.7  CL 96*  --  108 109  CO2 24  --  22 23  GLUCOSE 118*  --  114* 106*  BUN 15  --  11 10  CREATININE 1.13  --  0.96 0.86  CALCIUM 8.9  --  8.5* 8.5*  PROT 9.4*  --   --   --   ALBUMIN 3.9  --   --   --   AST 51*  --   --   --   ALT 36  --   --   --   ALKPHOS 47  --   --   --   BILITOT 1.4*  --   --   --   GFRNONAA >60  --  >60 >60  GFRAA >60  --  >60 >60  ANIONGAP 13  --  8 9     Hematology Recent Labs  Lab 05/09/19 0234 05/10/19 0538 05/11/19 0515  WBC 11.3* 9.4 8.5  RBC 4.78 4.15* 4.17*  HGB 13.6 11.8* 11.7*  HCT 41.4 36.2* 37.3*  MCV 86.6 87.2 89.4  MCH 28.5 28.4 28.1  MCHC 32.9 32.6 31.4  RDW 11.9 12.0 12.1  PLT 289 282 289    BNPNo results for input(s): BNP, PROBNP in the last 168 hours.   DDimer No results for input(s): DDIMER in the last 168 hours.   Radiology    Dg Chest 2 View  Result Date: 05/09/2019 CLINICAL DATA:  Fever.  MRI. EXAM: CHEST - 2 VIEW COMPARISON:  Chest x-ray report 07/08/2017. FINDINGS: Mediastinum hilar structures normal. Cardiac monitoring device is noted. Heart size normal. No focal infiltrate. No pleural effusion or pneumothorax. Degenerative change thoracic spine. IMPRESSION: No acute cardiopulmonary disease. Electronically Signed   By: Maisie Fushomas  Register   On: 05/09/2019 14:24   [place] Carotid Bilateral  Result Date: 05/09/2019 CLINICAL DATA:  Syncope.  Left-sided weakness. EXAM: BILATERAL CAROTID DUPLEX ULTRASOUND TECHNIQUE: Wallace CullensGray scale imaging, color Doppler and duplex ultrasound were performed of bilateral carotid and vertebral arteries in the  neck. COMPARISON:  Head CT 05/09/2019 FINDINGS: Criteria: Quantification of carotid stenosis is based on velocity parameters that correlate the residual internal carotid diameter with NASCET-based stenosis levels, using the diameter of the distal internal carotid lumen as the denominator for stenosis measurement. The following velocity measurements were obtained: RIGHT ICA: 91/36 cm/sec CCA: 97/25 cm/sec SYSTOLIC ICA/CCA RATIO:  0.9 ECA: 149 cm/sec LEFT ICA: 91/30 cm/sec CCA: 107/25 cm/sec SYSTOLIC ICA/CCA RATIO:  0.8 ECA: 166 cm/sec RIGHT CAROTID ARTERY: Intimal thickening in the right common carotid artery. Intimal thickening at the right carotid bulb. External carotid artery is patent with normal waveform. Intimal thickening or mild plaque in the internal carotid artery. Normal waveforms and velocities in the internal carotid artery. RIGHT VERTEBRAL ARTERY: Antegrade flow and normal waveform in the right vertebral artery. LEFT CAROTID ARTERY: Intimal thickening and minimal plaque involving the left common carotid artery and left carotid bulb. External carotid artery is patent with normal waveform. Small amount of echogenic plaque in the proximal internal carotid artery. Normal waveforms and velocities in the internal carotid artery. LEFT VERTEBRAL ARTERY: Antegrade flow and normal waveform in the left vertebral artery. 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 No significant stenosis. Estimated degree of stenosis in the internal carotid arteries is less than 50% bilaterally. Patent vertebral arteries with antegrade flow. Electronically Signed   By: Richarda OverlieAdam  Henn M.D.   On: 05/09/2019 11:16   Nm Myocar Multi W/spect W/wall Motion / Ef  Result Date: 05/10/2019  T wave inversion was noted during stress in the II, III, aVF, V3, V4, V5 and V6 leads.  There was no ST segment deviation noted during stress.  The study is normal.  This is a low risk study.  The  left ventricular ejection fraction is normal (55-65%).     Cardiac Studies   TTE (05/09/2019): 1. The left ventricle has normal systolic function, with an ejection fraction of 55-60%. The cavity size was normal. There is mild concentric left ventricular hypertrophy. Left ventricular diastolic Doppler parameters are consistent with impaired  relaxation. 2. The right ventricle has normal systolic function. The cavity was normal. There is no increase in right ventricular wall thickness. Right ventricular systolic pressure could not be assessed. 3. The mitral valve is grossly normal. 4. The  tricuspid valve is grossly normal. 5. The aortic valve is tricuspid. Mild calcification of the aortic valve. No stenosis of the aortic valve. 6. The aorta is normal in size and structure.  Pharmacologic MPI (05/09/2019):   T wave inversion was noted during stress in the II, III, aVF, V3, V4, V5 and V6 leads.  There was no ST segment deviation noted during stress.  The study is normal.  This is a low risk study.  The left ventricular ejection fraction is normal (55-65%).  Patient Profile     64 y.o. 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  Assessment & Plan    Syncope - Etiology most likely vasovagal and likely exacerbated by dehydration in the setting of febrile illness/sepsis with orthostatic hypotension noted this admission. Echo as above shows normal EF 55 to 60% and is without significant valvular or structural abnormality. Telemetry this admission has been without any sign of arrhythmia.  Loop interrogation without arrhythmia. Following IVF and abx treatment this admission, patient has denied further syncopal episodes or presyncope sx at rest or with ambulation in the hall.   --Ongoing fever and pulmonary findings, as seen on CT scans this admission,  are concerning for underlying infectious process with R lung base opacity. Recommend continue abx with recheck imaging following completion of abx course to confirm resolution of changes noted in imaging.  --Continued hydration encouraged.  --Patient indicated that he follows with [facility] and indicated preference to schedule follow-up with them after discharge. Previously seen by Dr. Lovena Le.  Elevated Troponin --Continues to deny chest pain.  EKG without acute ST/T changes.  Hs Tn peaked 849 in the setting of febrile illness. Stress test 8/3 ruled low risk study.  Elevated troponin likely supply demand mismatch in the setting of febrile illness/sepsis.  Continue ASA and statin. No plan for further ischemic evaluation this admission.  CAP --Fever improving but still present. Noted to also have leukocytosis this admission. As above, CT images from stress test concerning for pulmonary infection with right lung base opacity.  Recommend continuing antibiotics and follow-up imaging to confirm resolution of opacity after antibiotic course completion as discussed with IM.   For questions or updates, please contact [place] Please consult [email or web address] for contact info under        Signed, Arvil Chaco, PA-C  05/11/2019, 9:13 AM

## 2019-05-11 NOTE — Discharge Summary (Signed)
[facility] [place] NAME: Gar Glance    MR#:  [id number]  DATE OF BIRTH:  [birth year]  DATE OF ADMISSION:  05/09/2019 ADMITTING PHYSICIAN: Christel Mormon, MD  DATE OF DISCHARGE: 05/11/2019 12:00 PM  PRIMARY CARE PHYSICIAN: Arnetha Courser, MD    ADMISSION DIAGNOSIS:  Syncope [R55] NSTEMI (non-ST elevated myocardial infarction) ([place]) [I21.4] Syncope, unspecified syncope type [R55]  DISCHARGE DIAGNOSIS:  Active Problems:   Syncope   Chest pain in adult   Elevated troponin   SECONDARY DIAGNOSIS:   Past Medical History:  Diagnosis Date  . 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 Hypertension   . Stroke (cerebrum) ([place])   . Syncope     HOSPITAL COURSE:   1.  Pneumonia with fever.  Chest x-ray was negative here but on the stress test imaging showed a pneumonia in the right lower lobe.  The patient was started on Rocephin and Zithromax.  Patient did spike a temperature up to 103 2 days prior to discharge and it trended better.  Patient is currently feeling fine without symptoms.  COVID testing was negative on admission.  I do recommend a CT scan of the chest in 4 to 6 weeks to ensure clearing of the pneumonia (since initial chest x-ray was negative).  Patient also had a positive urine analysis and was given antibiotics for urinary tract infection.  Current urine culture still pending. 2.  Orthostatic hypotension.  I discontinued all his antihypertensive medications and gave IV fluid hydration.  Follow-up with blood pressure check as outpatient. 3.  Hypokalemia and weakness.  The patient was given potassium supplementation during the entire hospital course with IV fluids and oral supplementation.  Because the patient's hydrochlorothiazide was discontinued, I do not think he needs potassium supplementation upon going home.  I think the hydrochlorothiazide is not a good medication for this patient. 4.  Syncope and collapse.  Stress test  was negative and loop recording reviewed by cardiology showed no arrhythmia.  This is likely all secondary to orthostatic hypotension and dehydration. 5.  Elevated troponin.  This is likely demand ischemia.  Stress test negative. 6.  Hyperlipidemia unspecified on atorvastatin  DISCHARGE CONDITIONS:   Satisfactory  CONSULTS OBTAINED:  Cardiology  DRUG ALLERGIES:  No Known Allergies  DISCHARGE MEDICATIONS:   Allergies as of 05/11/2019   No Known Allergies     Medication List    STOP taking these medications   losartan-hydrochlorothiazide 100-25 MG tablet Commonly known as: HYZAAR   potassium chloride SA 20 MEQ tablet Commonly known as: K-DUR     TAKE these medications   aspirin 81 MG EC tablet Take 1 tablet (81 mg total) by mouth daily.   atorvastatin 40 MG tablet Commonly known as: LIPITOR Take one by mouth at bedtime for cholesterol   azithromycin 250 MG tablet Commonly known as: ZITHROMAX Take one tab po daily for three days Start taking on: May 12, 2019   cefdinir 300 MG capsule Commonly known as: OMNICEF Take 1 capsule (300 mg total) by mouth 2 (two) times daily for 5 days.        DISCHARGE INSTRUCTIONS:   Follow-up PMD 5 days  If you experience worsening of your admission symptoms, develop shortness of breath, life threatening emergency, suicidal or homicidal thoughts you must seek medical attention immediately by calling 911 or calling your MD immediately  if symptoms less severe.  You Must read complete instructions/literature along with all the  possible adverse reactions/side effects for all the Medicines you take and that have been prescribed to you. Take any new Medicines after you have completely understood and accept all the possible adverse reactions/side effects.   Please note  You were cared for by a hospitalist during your hospital stay. If you have any questions about your discharge medications or the care you received while you were in the  hospital after you are discharged, you can call the unit and asked to speak with the hospitalist on call if the hospitalist that took care of you is not available. Once you are discharged, your primary care physician will handle any further medical issues. Please note that NO REFILLS for any discharge medications will be authorized once you are discharged, as it is imperative that you return to your primary care physician (or establish a relationship with a primary care physician if you do not have one) for your aftercare needs so that they can reassess your need for medications and monitor your lab values.    Today   CHIEF COMPLAINT:   Chief Complaint  Patient presents with  . Loss of Consciousness    HISTORY OF PRESENT ILLNESS:  Meta HatchetMyron Boucher  is a [age] y.o. male came in with loss of consciousness   VITAL SIGNS:  Blood pressure 123/77, pulse 75, temperature 99.3 F (37.4 C), temperature source Oral, resp. rate 18, height 6' (1.829 m), weight 89.9 kg, SpO2 100 %.   PHYSICAL EXAMINATION:  GENERAL:  63 y.o.-year-old patient lying in the bed with no acute distress.  EYES: Pupils equal, round, reactive to light and accommodation. No scleral icterus. Extraocular muscles intact.  HEENT: Head atraumatic, normocephalic. Oropharynx and nasopharynx clear.  NECK:  Supple, no jugular venous distention. No thyroid enlargement, no tenderness.  LUNGS: Normal breath sounds bilaterally, no wheezing, rales,rhonchi or crepitation. No use of accessory muscles of respiration.  CARDIOVASCULAR: S1, S2 normal. No murmurs, rubs, or gallops.  ABDOMEN: Soft, non-tender, non-distended. Bowel sounds present. No organomegaly or mass.  EXTREMITIES: No pedal edema, cyanosis, or clubbing.  NEUROLOGIC: Cranial nerves II through XII are intact. Muscle strength 5/5 in all extremities. Sensation intact. Gait not checked.  PSYCHIATRIC: The patient is alert and oriented x 3.  SKIN: No obvious rash, lesion, or ulcer.    DATA REVIEW:   CBC Recent Labs  Lab 05/11/19 0515  WBC 8.5  HGB 11.7*  HCT 37.3*  PLT 289    Chemistries  Recent Labs  Lab 05/09/19 0234 05/09/19 0447  05/11/19 0515  NA 133*  --    < > 141  K 2.9*  --    < > 3.7  CL 96*  --    < > 109  CO2 24  --    < > 23  GLUCOSE 118*  --    < > 106*  BUN 15  --    < > 10  CREATININE 1.13  --    < > 0.86  CALCIUM 8.9  --    < > 8.5*  MG  --  2.3  --   --   AST 51*  --   --   --   ALT 36  --   --   --   ALKPHOS 47  --   --   --   BILITOT 1.4*  --   --   --    < > = values in this interval not displayed.    Microbiology Results  Results for  orders placed or performed during the hospital encounter of 05/09/19  SARS Coronavirus 2 [facility](Hospital order, Performed in [facility] hospital lab) Nasopharyngeal Nasopharyngeal Swab     Status: None   Collection Time: 05/09/19  4:18 AM   Specimen: Nasopharyngeal Swab  Result Value Ref Range Status   SARS Coronavirus 2 NEGATIVE NEGATIVE Final    Comment: (NOTE) If result is NEGATIVE SARS-CoV-2 target nucleic acids are NOT DETECTED. The SARS-CoV-2 RNA is generally detectable in upper and lower  respiratory specimens during the acute phase of infection. The lowest  concentration of SARS-CoV-2 viral copies this assay can detect is 250  copies / mL. A negative result does not preclude SARS-CoV-2 infection  and should not be used as the sole basis for treatment or other  patient management decisions.  A negative result may occur with  improper specimen collection / handling, submission of specimen other  than nasopharyngeal swab, presence of viral mutation(s) within the  areas targeted by this assay, and inadequate number of viral copies  (<250 copies / mL). A negative result must be combined with clinical  observations, patient history, and epidemiological information. If result is POSITIVE SARS-CoV-2 target nucleic acids are DETECTED. The SARS-CoV-2 RNA is generally detectable in upper and  lower  respiratory specimens dur ing the acute phase of infection.  Positive  results are indicative of active infection with SARS-CoV-2.  Clinical  correlation with patient history and other diagnostic information is  necessary to determine patient infection status.  Positive results do  not rule out bacterial infection or co-infection with other viruses. If result is PRESUMPTIVE POSTIVE SARS-CoV-2 nucleic acids MAY BE PRESENT.   A presumptive positive result was obtained on the submitted specimen  and confirmed on repeat testing.  While 2019 novel coronavirus  (SARS-CoV-2) nucleic acids may be present in the submitted sample  additional confirmatory testing may be necessary for epidemiological  and / or clinical management purposes  to differentiate between  SARS-CoV-2 and other Sarbecovirus currently known to infect humans.  If clinically indicated additional testing with an alternate test  methodology [phone] is advised. The SARS-CoV-2 RNA is generally  detectable in upper and lower respiratory sp ecimens during the acute  phase of infection. The expected result is Negative. Fact Sheet for Patients:  BoilerBrush.com.cyhttps://www.fda.gov/media/136312/download Fact Sheet for Healthcare Providers: https://pope.com/https://www.fda.gov/media/136313/download This test is not yet approved or cleared by the [place] FDA and has been authorized for detection and/or diagnosis of SARS-CoV-2 by FDA under an Emergency Use Authorization (EUA).  This EUA will remain in effect (meaning this test can be used) for the duration of the COVID-19 declaration under Section 564(b)(1) of the Act, 21 U.S.C. section 360bbb-3(b)(1), unless the authorization is terminated or revoked sooner. Performed at [facility], [address] [place], [place] [address]   Urine Culture     Status: Abnormal (Preliminary result)   Collection Time: 05/09/19  4:00 PM   Specimen: Urine, Clean Catch  Result Value Ref Range Status    Specimen Description   Final    URINE, CLEAN CATCH Performed at [facility], [address] [place], [place] [address]    Special Requests   Final    NONE Performed at [facility], [address] [place], [place] [address]    Culture (A)  Final    >=100,000 COLONIES/mL ESCHERICHIA COLI SUSCEPTIBILITIES TO FOLLOW Performed at [facility], [address] [place], [place] [address]    Report Status PENDING  Incomplete    RADIOLOGY:  Dg Chest 2 View  Result Date: 05/09/2019 CLINICAL DATA:  Fever.  MRI. EXAM: CHEST - 2 VIEW COMPARISON:  Chest x-ray report 07/08/2017. FINDINGS: Mediastinum hilar structures normal. Cardiac monitoring device is noted. Heart size normal. No focal infiltrate. No pleural effusion or pneumothorax. Degenerative change thoracic spine. IMPRESSION: No acute cardiopulmonary disease. Electronically Signed   By: Maisie Fushomas  Register   On: 05/09/2019 14:24    Management plans discussed with the patient, family and they are in agreement.  CODE STATUS:     Code Status Orders  (From admission, onward)         Start     Ordered   05/09/19 0421  Full code  Continuous     05/09/19 0420        Code Status History    Date Active Date Inactive Code Status Order ID Comments User Context   07/08/2017 1908 07/10/2017 2153 Full Code [id number]  Camelia PhenesHoffman, Jessica Ratliff, DO Inpatient   07/05/2017 1844 07/06/2017 2031 Full Code [id number]  Camelia PhenesHoffman, Jessica Ratliff, DO Inpatient   Advance Care Planning Activity    Advance Directive Documentation     Most Recent Value  Type of Advance Directive  Healthcare Power of Attorney, Living will  Pre-existing out of facility DNR order (yellow form or pink MOST form)  -  "MOST" Form in Place?  -      TOTAL TIME TAKING CARE OF THIS PATIENT: 35 minutes.    Alford Highlandichard Camyah Pultz M.D on 05/11/2019 at 1:02 PM  Between 7am to 6pm - Pager - [phone]  After 6pm go to [email or web address] - password [email or web address]  [facility]  [phone]  CC: Primary care physician; Kerman PasseyLada, [name], MD

## 2019-05-12 LAB — URINE CULTURE: Culture: >=100000 — AB

## 2019-05-23 ENCOUNTER — Ambulatory Visit: Payer: Self-pay | Admitting: *Deleted

## 2019-05-23 DIAGNOSIS — I63321 Cerebral infarction due to thrombosis of right anterior cerebral artery: Secondary | ICD-10-CM

## 2019-05-23 LAB — CUP PACEART REMOTE DEVICE CHECK
Date Time Interrogation Session: 20200817134510
Implantable Pulse Generator Implant Date: 20181005

## 2019-05-31 NOTE — Progress Notes (Signed)
Carelink Summary Report / Loop Recorder 

## 2019-06-21 ENCOUNTER — Telehealth: Payer: Self-pay | Admitting: Internal Medicine

## 2019-06-21 NOTE — Telephone Encounter (Signed)
New Message     Pt is calling because he has questions about his device     Please call

## 2019-06-21 NOTE — Telephone Encounter (Signed)
Pt asked was it safe to leave the Linq in and not have it removed. I told him it is safe to leave the ling in and not have it removed. The pt states he do not want to be cut if he do not have to. The pt states he still have his monitor plugged in and thought we disconnected his monitor because he did not pay the bill. I explained to the pt that maybe it is an error in his monitor or maybe someone accidentally unplugged his monitor. I let him know we do not have a way of turning his monitor off. I told him the only way we will not see his monitor is if we discontinued his monitor in [facility] and that way we will not be able to see it at all. I told him if he do not want [place] to monitor his Linq to unplug the monitor because that is the only way we will not be able to get a report from his monitor. The pt states that I answered all his questions and thanked me for the call.

## 2019-06-24 ENCOUNTER — Ambulatory Visit (INDEPENDENT_AMBULATORY_CARE_PROVIDER_SITE_OTHER): Payer: Non-veteran care | Admitting: *Deleted

## 2019-06-24 DIAGNOSIS — I63321 Cerebral infarction due to thrombosis of right anterior cerebral artery: Secondary | ICD-10-CM | POA: Diagnosis not present

## 2019-06-26 LAB — CUP PACEART REMOTE DEVICE CHECK
Date Time Interrogation Session: 20200919153952
Implantable Pulse Generator Implant Date: 20181005

## 2019-06-27 NOTE — Progress Notes (Signed)
Carelink Summary Report / Loop Recorder 

## 2019-07-28 ENCOUNTER — Ambulatory Visit (INDEPENDENT_AMBULATORY_CARE_PROVIDER_SITE_OTHER): Payer: No Typology Code available for payment source | Admitting: *Deleted

## 2019-07-28 DIAGNOSIS — I63321 Cerebral infarction due to thrombosis of right anterior cerebral artery: Secondary | ICD-10-CM

## 2019-07-28 DIAGNOSIS — R55 Syncope and collapse: Secondary | ICD-10-CM

## 2019-07-28 LAB — CUP PACEART REMOTE DEVICE CHECK
Date Time Interrogation Session: 20201022141117
Implantable Pulse Generator Implant Date: 20181005

## 2019-08-08 NOTE — Progress Notes (Signed)
Carelink Summary Report / Loop Recorder 

## 2019-08-30 ENCOUNTER — Ambulatory Visit (INDEPENDENT_AMBULATORY_CARE_PROVIDER_SITE_OTHER): Payer: No Typology Code available for payment source | Admitting: *Deleted

## 2019-08-30 DIAGNOSIS — I63321 Cerebral infarction due to thrombosis of right anterior cerebral artery: Secondary | ICD-10-CM

## 2019-08-30 LAB — CUP PACEART REMOTE DEVICE CHECK
Date Time Interrogation Session: 20201124105840
Implantable Pulse Generator Implant Date: 20181005

## 2019-09-03 IMAGING — MR MR HEAD W/O CM
9 of 12 series · 29 of 48 positions shown · non-contrast
Comparison: 07/05/2017 MRI.  07/08/2017 CT.

CLINICAL DATA: Left-sided weakness. Sellar mass. Right frontal
infarct.

EXAM:
MRI HEAD WITHOUT CONTRAST
TECHNIQUE: Multiplanar, multiecho pulse sequences of the brain and surrounding
structures were obtained without intravenous contrast.

[Series 3: DWI · axial · 3.0mm · 0.94mm/px · z∈[-73,+72]mm · 5 of 99 slices shown (1 of 2)]
[im 1/99]
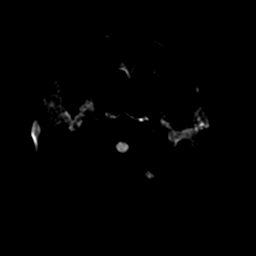
[im 25/99]
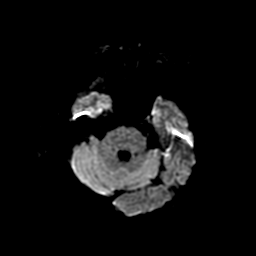
[im 50/99]
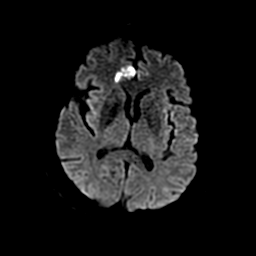
[im 74/99]
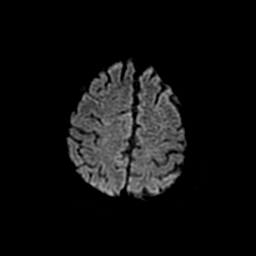
[im 99/99]
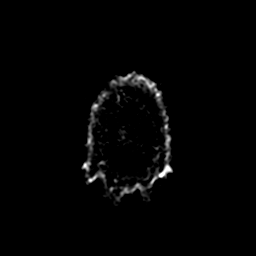

[Series 4: DWI · coronal · 4.0mm · 0.94mm/px · 5 of 72 slices shown (2 of 2)]
[im 1/72]
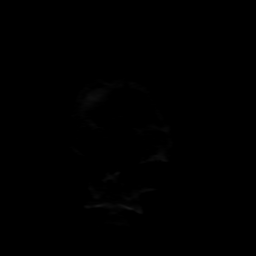
[im 18/72]
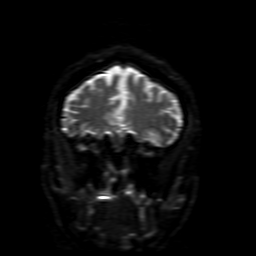
[im 36/72]
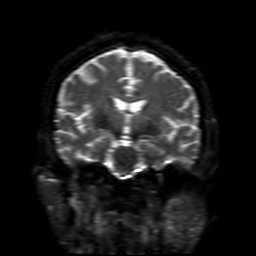
[im 54/72]
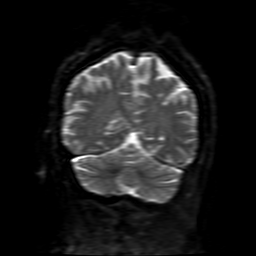
[im 72/72]
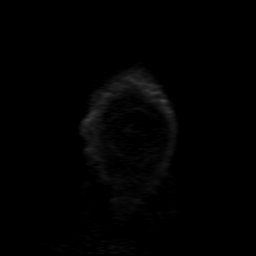

[Series 5: FLAIR · sagittal · 5.0mm · 0.47mm/px · 2 of 26 slices shown (1 of 2)]
[im 1/26]
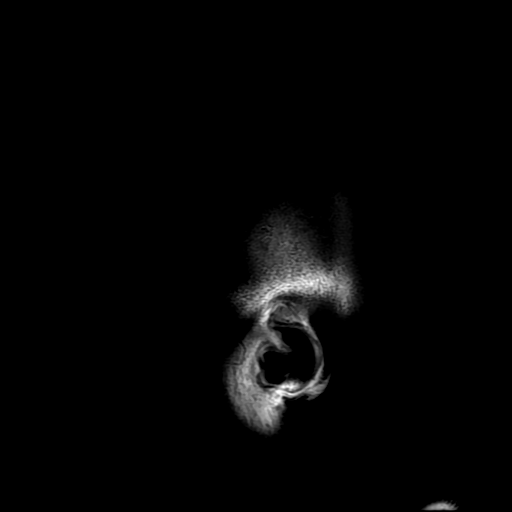
[im 26/26]
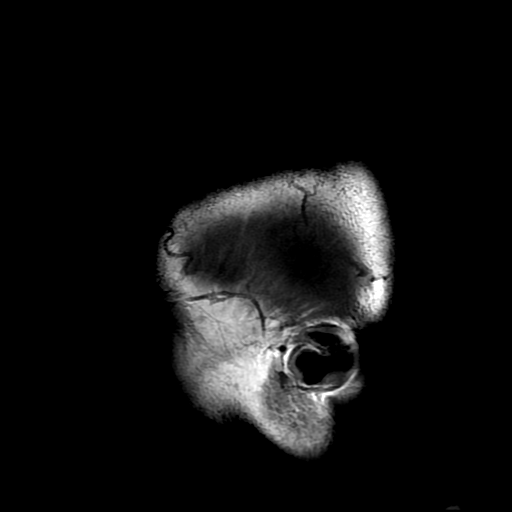

[Series 7: T2 · axial · 5.0mm · 0.47mm/px · z∈[-81,+79]mm · 2 of 28 slices shown (1 of 2)]
[im 1/28]
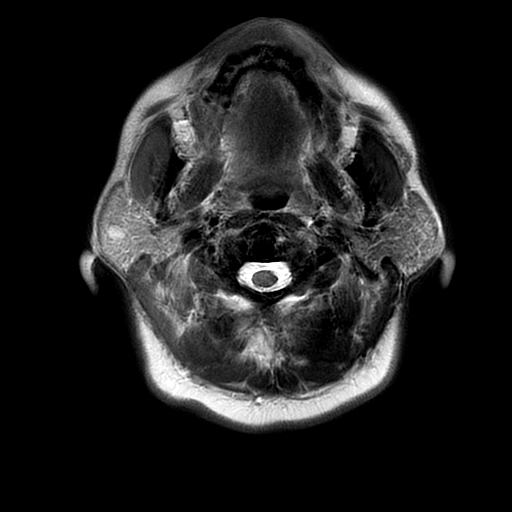
[im 28/28]
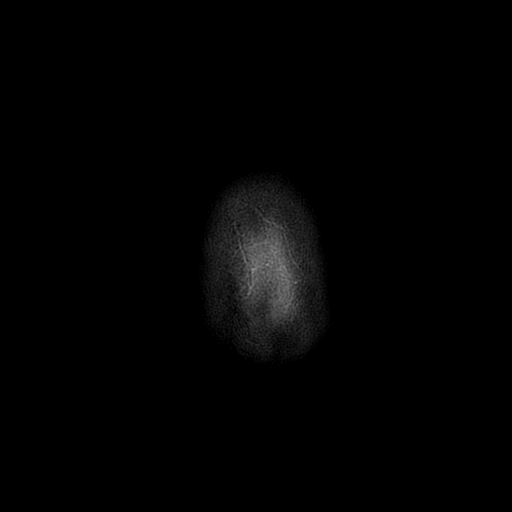

[Series 8: FLAIR · axial · 5.0mm · 0.47mm/px · z∈[-81,+79]mm · 2 of 28 slices shown (2 of 2)]
[im 1/28]
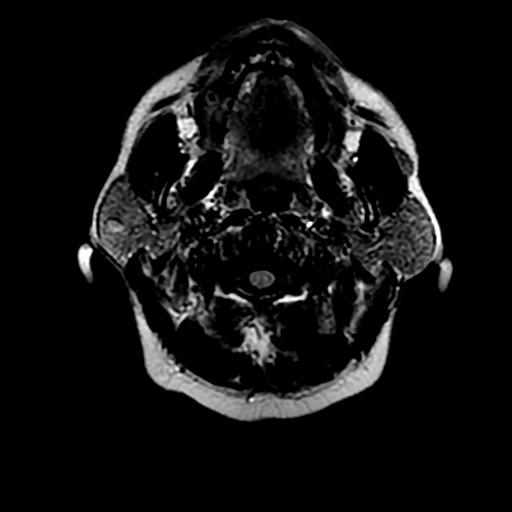
[im 28/28]
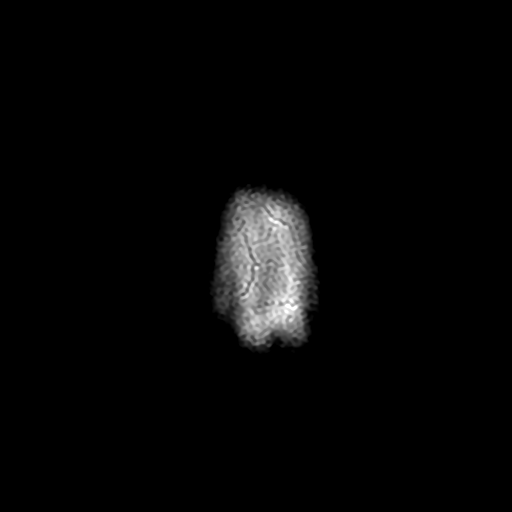

[Series 9: (person_name) · axial · 3.0mm · 0.47mm/px · z∈[-79,+46]mm · 6 of 104 slices shown]
[im 1/104]
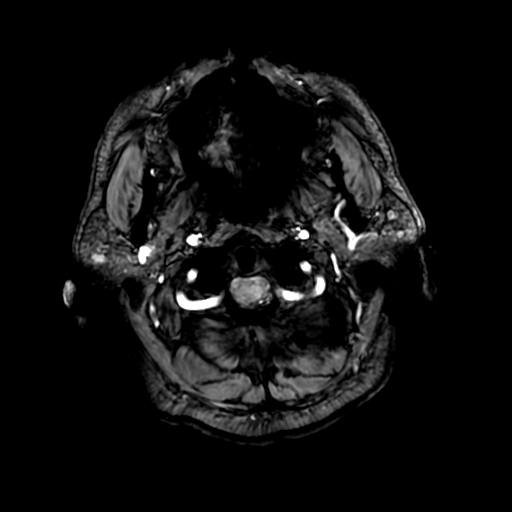
[im 18/104]
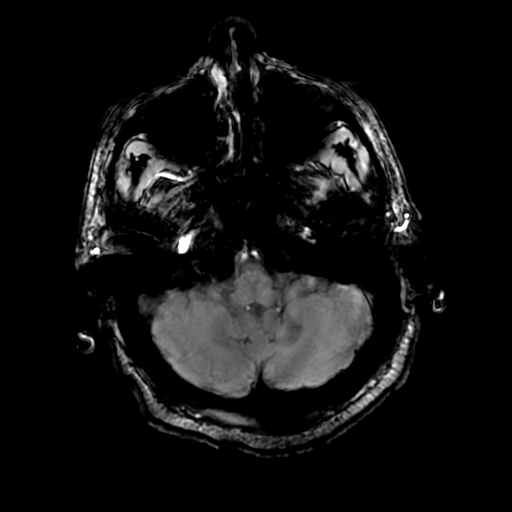
[im 35/104]
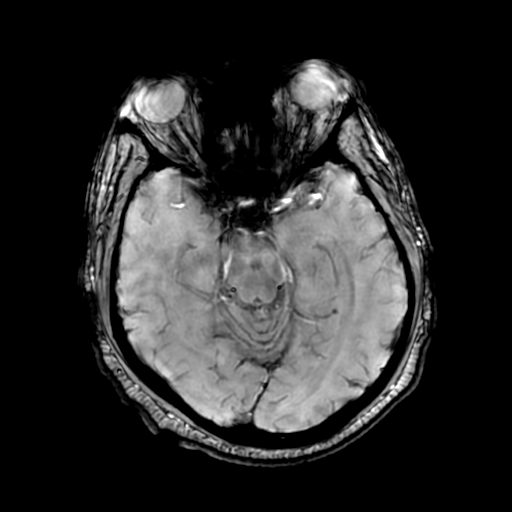
[im 52/104]
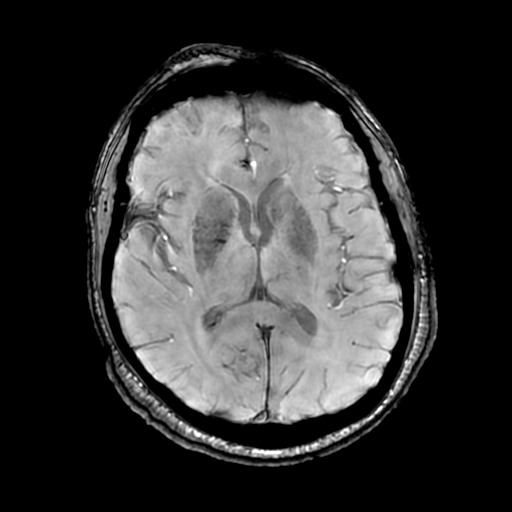
[im 69/104]
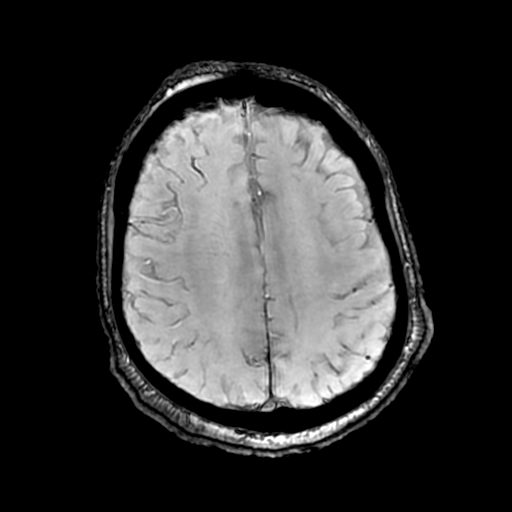
[im 86/104]
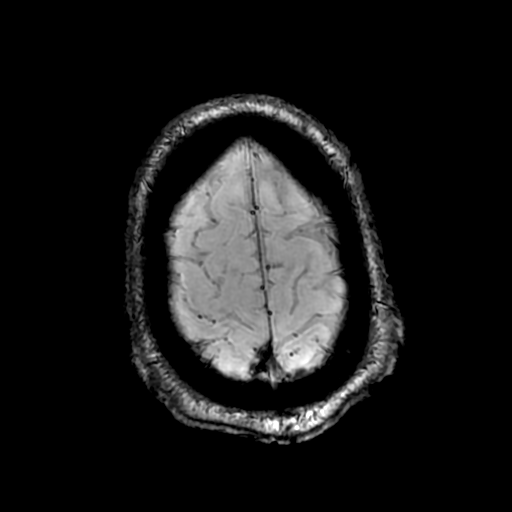

[Series 11: T2 · coronal · 5.0mm · 0.47mm/px · 2 of 30 slices shown (2 of 2)]
[im 1/30]
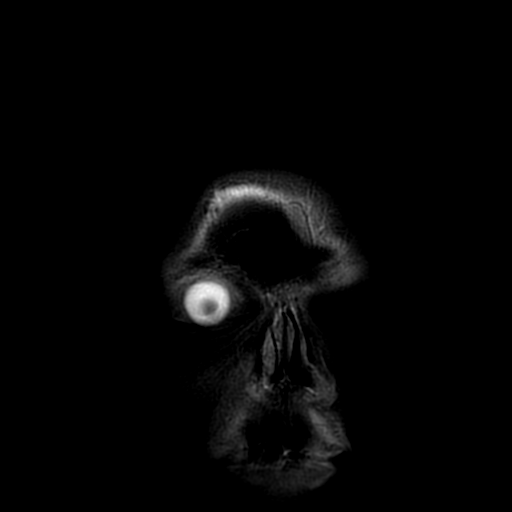
[im 30/30]
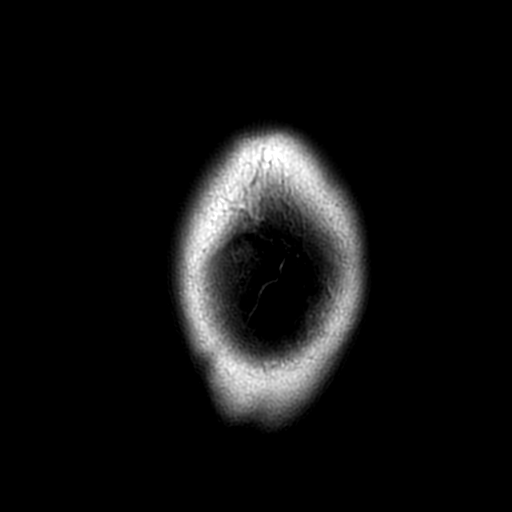

[Series 350: ADC · axial · 3.0mm · 0.94mm/px · z∈[-73,+72]mm · 3 of 48 slices shown (1 of 2)]
[im 1/48]
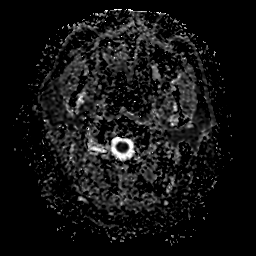
[im 24/48]
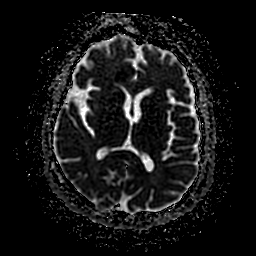
[im 48/48]
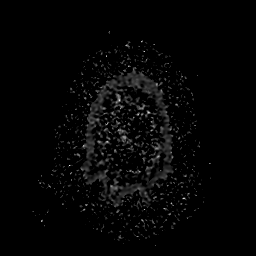

[Series 450: ADC · coronal · 4.0mm · 0.94mm/px · 2 of 36 slices shown (2 of 2)]
[im 1/36]
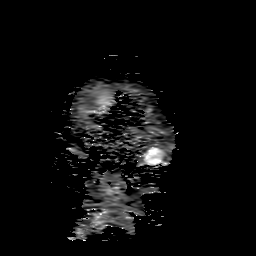
[im 36/36]
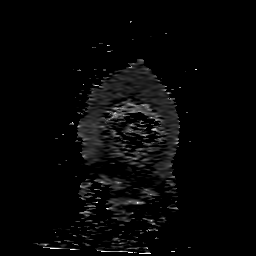

[29 of 48 positions shown; findings below may reference images not displayed]

FINDINGS: Brain: There has been slight extension of the medial right frontal
infarction when compared to the MR study of 07/05/2017. Previously
this measured about 1 x 1.5 cm. Today this measures about 2 x
cm. Mild swelling in the region of infarction but no evidence of
hemorrhage or mass effect. No new area of infarction is identified.
Elsewhere, there is a small amount of proteinaceous signal in the
sulci in the occipital regions right more than left, probably either
some minimal blood products or protein related to the infarction.
Also, there appears to be a right frontal scalp hematoma suggesting
that the patient might have recently fallen with trauma to the head.
Therefore, this could be a small amount of post traumatic
subarachnoid hemorrhage. Punctate foci of susceptibility artifact in
the left temporal lobe were present previously and are not likely
significant, probably relating either to old head trauma or tiny
developmental venous anomalies. No hydrocephalus. No extra-axial
fluid collection. Sellar and suprasellar Aujla cleft cysts as
noted previously.

Vascular: Major vessels at the base of the brain show flow.

Skull and upper cervical spine: Right frontal scalp hematoma
suggesting recent fall. No bone finding.

Sinuses/Orbits: Clear/normal

Other: None
IMPRESSION: Slight enlargement of the region of infarction in the right frontal
lobe, now measuring about 2 x 2.5 cm as opposed to 1 x 1.5 cm on the
previous study. No evidence of mass effect or parenchymal hemorrhage
in this location.

Newly seen right frontal scalp swelling, presumably hematoma
secondary to a fall.

Abnormal FLAIR and gradient signal in the occipital sulci right more
than left. This could be proteinaceous material or blood secondary
to the recent infarction or could represent a small amount of post
traumatic subarachnoid hemorrhage if in fact the right frontal scalp
finding does indicate recent fall.

## 2019-10-03 ENCOUNTER — Ambulatory Visit (INDEPENDENT_AMBULATORY_CARE_PROVIDER_SITE_OTHER): Payer: No Typology Code available for payment source | Admitting: *Deleted

## 2019-10-03 DIAGNOSIS — I63321 Cerebral infarction due to thrombosis of right anterior cerebral artery: Secondary | ICD-10-CM

## 2019-10-03 LAB — CUP PACEART REMOTE DEVICE CHECK
Date Time Interrogation Session: 20201227105646
Implantable Pulse Generator Implant Date: 20181005

## 2019-10-04 NOTE — Progress Notes (Signed)
ILR remote 

## 2019-11-28 ENCOUNTER — Telehealth: Payer: Self-pay | Admitting: *Deleted

## 2019-11-28 NOTE — Telephone Encounter (Signed)
Received message from Hunt Oris in billing department requesting that I contact patient regarding ILR billing.  Patient reports he was terminated from his job last year and lost his insurance.  Reports that our office should not have continued billing him for monthly summary reports.  Explained that I do not see where he called to request discontinuation of monitoring.  Pt states that this was not his responsibility and that his former insurance company should've been done this.  States "this is between your office and the insurance, not me."  Advised that it is not the insurance company's responsibility to make the office aware when coverage is terminated.  Pt reports that he was on the phone with a representative of [employer] (? collections agency, pt is unsure) and after much back and forth reports he was told he is not responsible for the billed charges. Explained that I am not able to assist him with billing issues and will need to forward this message to the billing manager.  Pt verbalizes understanding but again states that he is not responsible for the charges.  Discussed ILR monitoring. Advised monitor last communicated on 09/22/19, and that he has not been billed since 09/2019. Unenrolled from [place] at patient's request, return kit ordered to confirmed home address. Pt declines ILR explant at this time. Spent 20 minutes on phone with patient.  Routed to Alta Corning for further assistance.

## 2021-07-03 IMAGING — US BILATERAL CAROTID DUPLEX ULTRASOUND
1 series · 13 of 24 positions shown · non-contrast
Comparison: Head CT 05/09/2019

CLINICAL DATA: Syncope.  Left-sided weakness.

EXAM:
BILATERAL CAROTID DUPLEX ULTRASOUND
TECHNIQUE: Gray scale imaging, color Doppler and duplex ultrasound were
performed of bilateral carotid and vertebral arteries in the neck.

[Series 1: bilateral carotid duplex ultrasound · 13 of 66 slices shown]
[im 1/66]
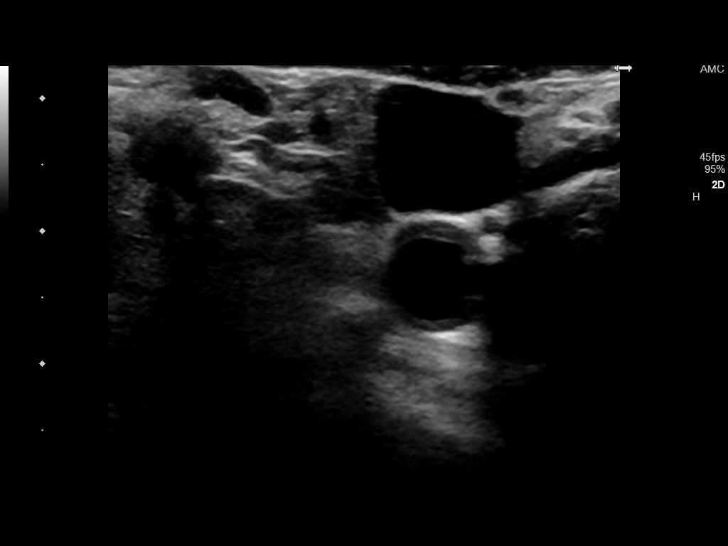
[im 6/66]
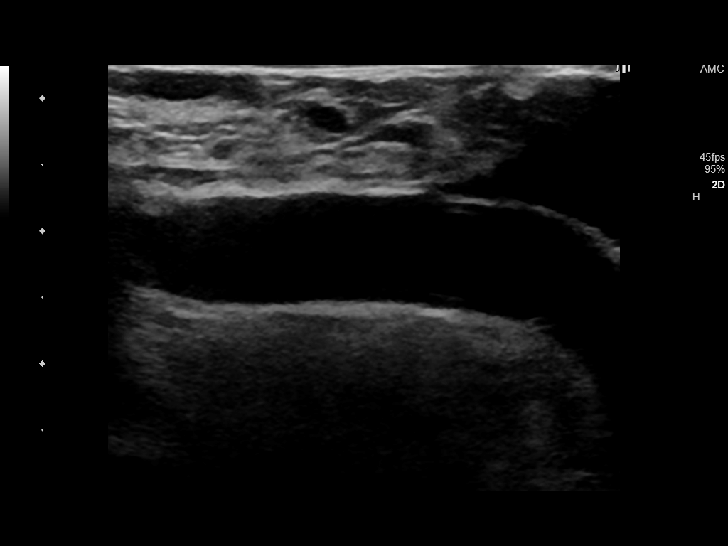
[im 12/66]
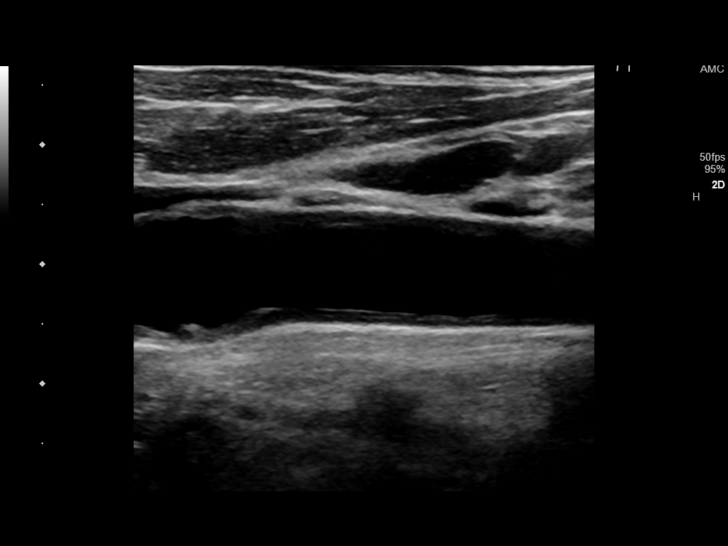
[im 17/66]
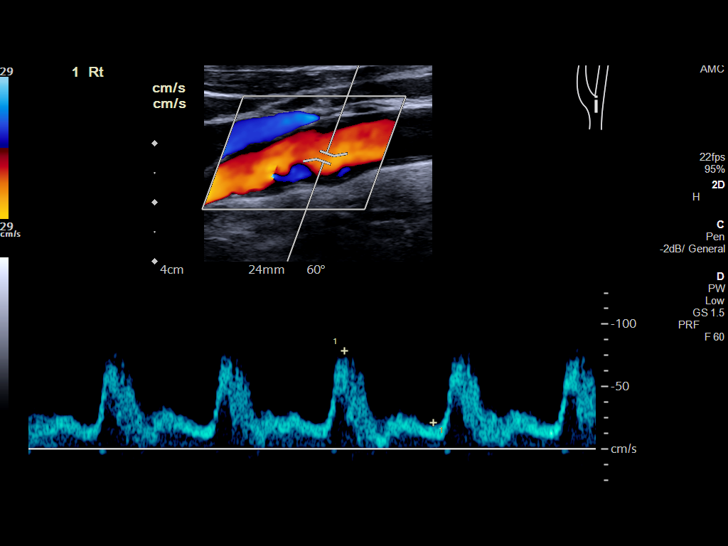
[im 23/66]
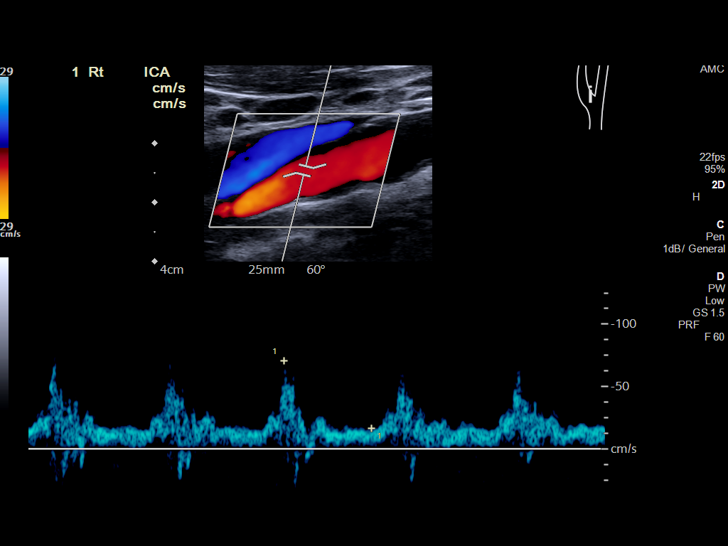
[im 29/66]
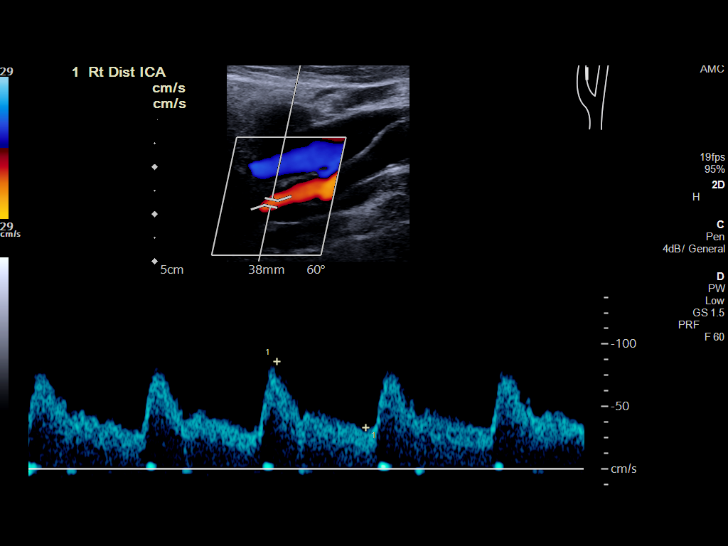
[im 34/66]
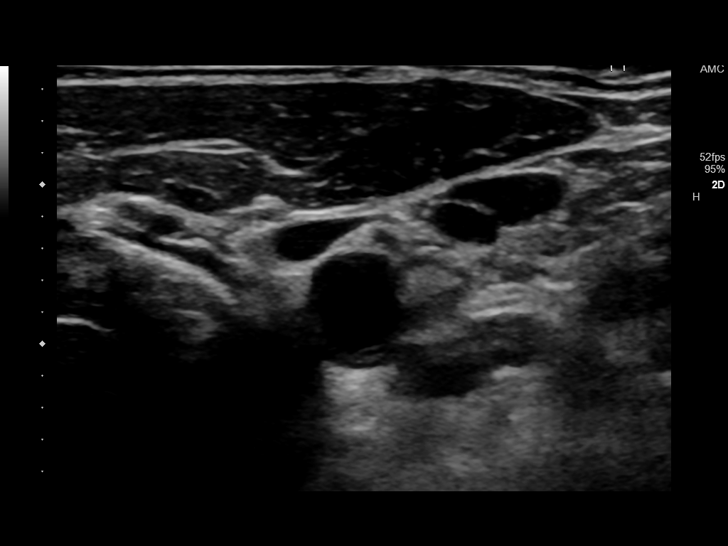
[im 37/66]
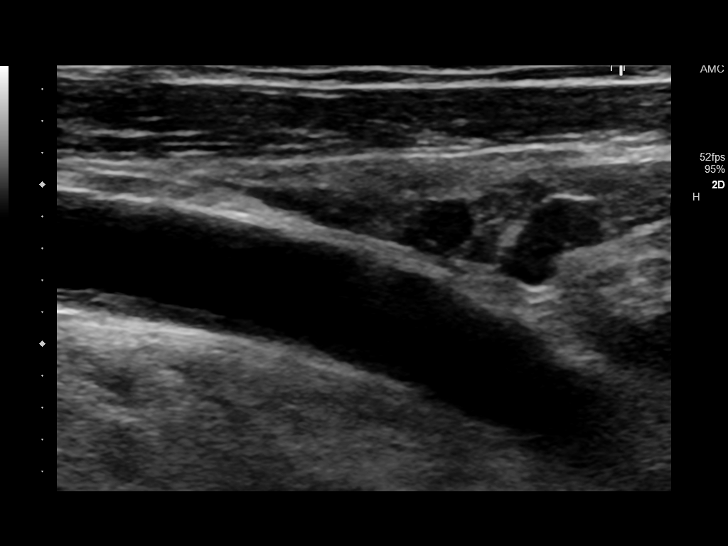
[im 43/66]
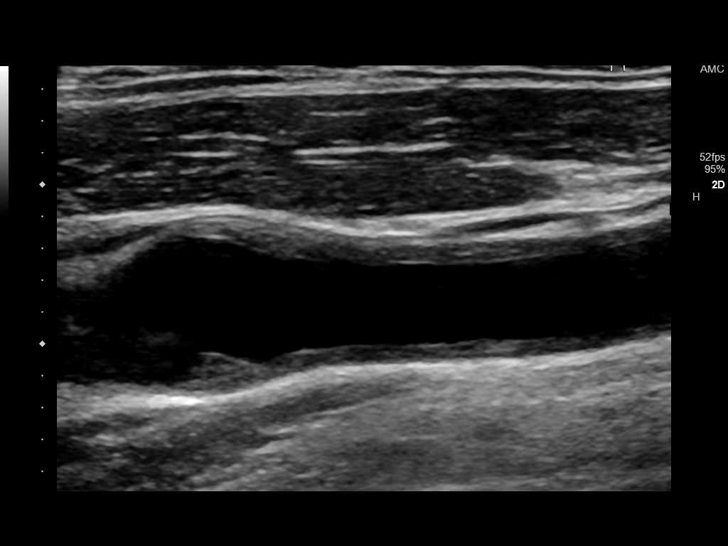
[im 49/66]
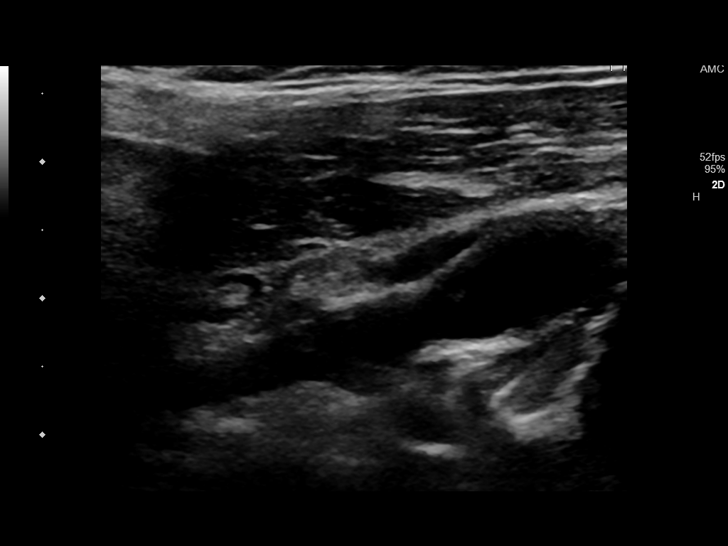
[im 54/66]
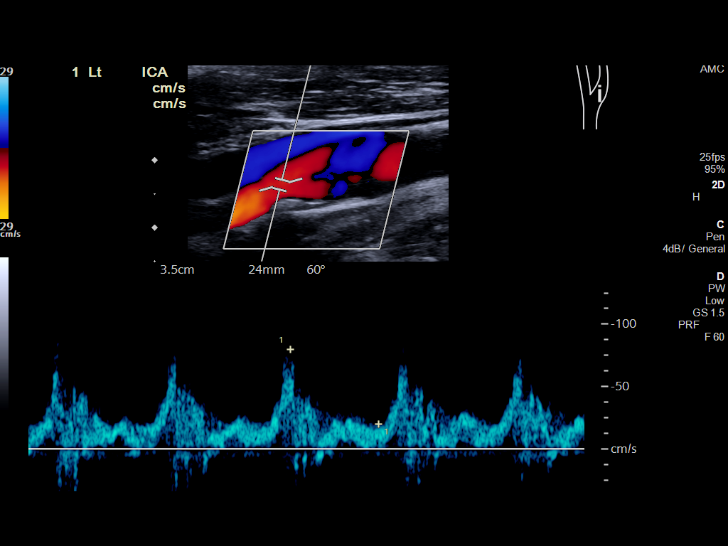
[im 60/66]
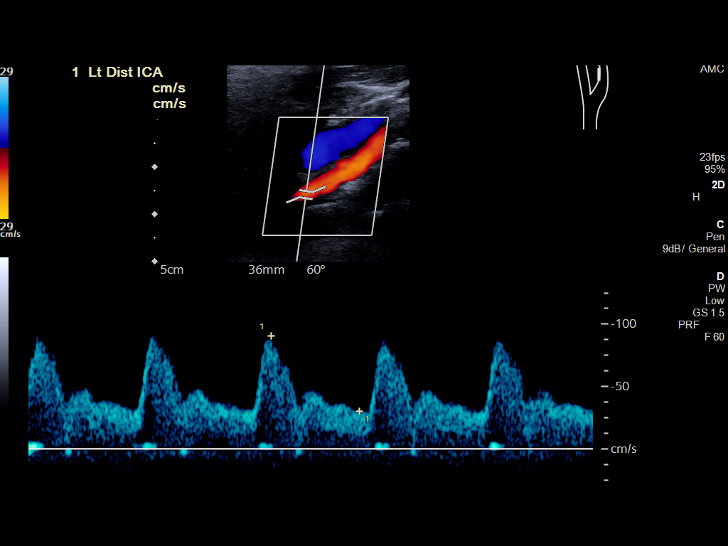
[im 66/66]
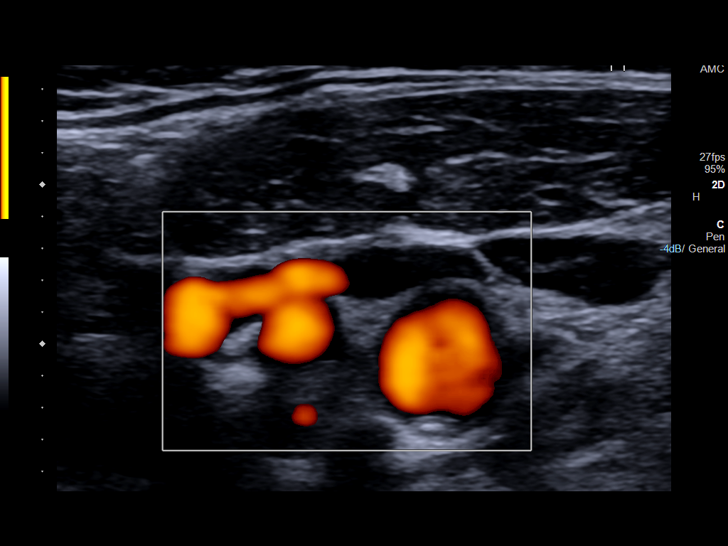

[13 of 24 positions shown; findings below may reference images not displayed]

FINDINGS: Criteria: Quantification of carotid stenosis is based on velocity
parameters that correlate the residual internal carotid diameter
with NASCET-based stenosis levels, using the diameter of the distal
internal carotid lumen as the denominator for stenosis measurement.

The following velocity measurements were obtained:

RIGHT

ICA: 91/36 cm/sec

CCA: 97/25 cm/sec

SYSTOLIC ICA/CCA RATIO:

ECA: 149 cm/sec

LEFT

ICA: 91/30 cm/sec

CCA: 107/25 cm/sec

SYSTOLIC ICA/CCA RATIO:

ECA: 166 cm/sec

RIGHT CAROTID ARTERY: Intimal thickening in the right common carotid
artery. Intimal thickening at the right carotid bulb. External
carotid artery is patent with normal waveform. Intimal thickening or
mild plaque in the internal carotid artery. Normal waveforms and
velocities in the internal carotid artery.

RIGHT VERTEBRAL ARTERY: Antegrade flow and normal waveform in the
right vertebral artery.

LEFT CAROTID ARTERY: Intimal thickening and minimal plaque involving
the left common carotid artery and left carotid bulb. External
carotid artery is patent with normal waveform. Small amount of
echogenic plaque in the proximal internal carotid artery. Normal
waveforms and velocities in the internal carotid artery.

LEFT VERTEBRAL ARTERY: Antegrade flow and normal waveform in the
left vertebral artery.
IMPRESSION: Mild atherosclerotic disease in the carotid arteries, left side
greater than right. No significant stenosis. Estimated degree of
stenosis in the internal carotid arteries is less than 50%
bilaterally.

Patent vertebral arteries with antegrade flow.

## 2022-06-07 ENCOUNTER — Other Ambulatory Visit: Payer: Self-pay

## 2022-06-07 ENCOUNTER — Encounter: Payer: Self-pay | Admitting: Emergency Medicine

## 2022-06-07 ENCOUNTER — Emergency Department
Admission: EM | Admit: 2022-06-07 | Discharge: 2022-06-07 | Disposition: A | Discharge status: Home / Self Care | Payer: No Typology Code available for payment source | Attending: Emergency Medicine | Admitting: Emergency Medicine

## 2022-06-07 DIAGNOSIS — R059 Cough, unspecified: Secondary | ICD-10-CM | POA: Diagnosis present

## 2022-06-07 DIAGNOSIS — R739 Hyperglycemia, unspecified: Secondary | ICD-10-CM | POA: Diagnosis not present

## 2022-06-07 DIAGNOSIS — I10 Essential (primary) hypertension: Secondary | ICD-10-CM | POA: Insufficient documentation

## 2022-06-07 DIAGNOSIS — U071 COVID-19: Secondary | ICD-10-CM

## 2022-06-07 DIAGNOSIS — R7309 Other abnormal glucose: Secondary | ICD-10-CM

## 2022-06-07 LAB — RESP PANEL BY RT-PCR (FLU A&B, COVID) ARPGX2
Influenza A by PCR: NEGATIVE
Influenza B by PCR: NEGATIVE
SARS Coronavirus 2 by RT PCR: POSITIVE — AB

## 2022-06-07 LAB — CBG MONITORING, ED: Glucose-Capillary: 507 mg/dL (ref 70–99)

## 2022-06-07 MED ORDER — METFORMIN HCL 500 MG PO TABS: 500.0000 mg | ORAL_TABLET | Freq: Two times a day (BID) | ORAL | 1 refills | Status: AC

## 2022-06-07 NOTE — ED Triage Notes (Signed)
Pt reports had a sore throat, cough and congestion but they have gone away now. Pt states his wife told him to come and get checked anyway

## 2022-06-07 NOTE — ED Provider Notes (Signed)
[facility] Provider Note    Event Date/Time   First MD Initiated Contact with Patient 06/07/22 1319     (approximate)   History   Sore Throat   HPI  Tyler Gomez is a 66 y.o. male   presents to the ED with complaint of sore throat, cough, congestion that began suddenly approximately 3 days ago.  Patient is here also with his wife who just returned from [facility] and she has same symptoms.  He denies any difficulty breathing or shortness of breath.  Patient has been fully vaccinated for COVID.He has a history of hypertension, CVA, syncope.  Patient states he has also had a lot of "dry mouth".  He states that no one in his family is diabetic and that he has been checked in the past and was not found to be diabetic.       Physical Exam   Triage Vital Signs: ED Triage Vitals  Enc Vitals Group     BP 06/07/22 1000 (!) 145/120     Pulse Rate 06/07/22 1000 (!) 107     Resp 06/07/22 1000 20     Temp 06/07/22 1000 98.4 F (36.9 C)     Temp Source 06/07/22 1000 Oral     SpO2 06/07/22 1000 96 %     Weight 06/07/22 0958 195 lb (88.5 kg)     Height 06/07/22 0958 6' (1.829 m)     Head Circumference --      Peak Flow --      Pain Score 06/07/22 0958 3     Pain Loc --      Pain Edu? --      Excl. in GC? --     Most recent vital signs: Vitals:   06/07/22 1000 06/07/22 1407  BP: (!) 145/120 (!) 152/103  Pulse: (!) 107 95  Resp: 20 19  Temp: 98.4 F (36.9 C) 98.3 F (36.8 C)  SpO2: 96% 99%     General: Awake, no distress.  CV:  Good peripheral perfusion.  Heart regular rate and rhythm. Resp:  Normal effort.  Lungs are clear. Abd:  No distention.  Other:     ED Results / Procedures / Treatments   Labs (all labs ordered are listed, but only abnormal results are displayed) Labs Reviewed  RESP PANEL BY RT-PCR (FLU A&B, COVID) ARPGX2 - Abnormal; Notable for the following components:      Result Value   SARS Coronavirus 2 by RT PCR  POSITIVE (*)    All other components within normal limits  CBG MONITORING, ED - Abnormal; Notable for the following components:   Glucose-Capillary 507 (*)    All other components within normal limits     PROCEDURES:  Critical Care performed:   Procedures   MEDICATIONS ORDERED IN ED: Medications - No data to display   IMPRESSION / MDM / ASSESSMENT AND PLAN / ED COURSE  I reviewed the triage vital signs and the nursing notes.   Differential diagnosis includes, but is not limited to, viral illness, upper respiratory infection, COVID, influenza, questionable diabetes.  66 year old male presents to the ED with his wife after being at [facility] with his family.  His wife also is here to be seen with similar symptoms.  Patient was made aware that his COVID test was positive and influenza test is negative.  I did speak to him about his glucose level being 507 which she completely denies the fact that he possibly  could be diabetic.  He states he has been on "sugar diet" and has been drinking approximately 12 sugar sodas and sports drinks a day.  We had a long discussion about my comfort level of discharging him without medication and he agrees to start on metformin.  He he states he has an appointment this coming week at the [facility] and will have his doctor reevaluate.  In the meantime he will discontinue drinking his sports drinks and sodas.  His wife is seeing next to him and states that she will make sure that he is not drinking that many soft drinks and that they will be sugar-free.      Patient's presentation is most consistent with acute complicated illness / injury requiring diagnostic workup.  FINAL CLINICAL IMPRESSION(S) / ED DIAGNOSES   Final diagnoses:  COVID  Elevated glucose level     Rx / DC Orders   ED Discharge Orders          Ordered    metFORMIN (GLUCOPHAGE) 500 MG tablet  2 times daily with meals        06/07/22 1354             Note:  This  document was prepared using Dragon voice recognition software and may include unintentional dictation errors.   Tommi Rumps, PA-C 06/07/22 1518    Pilar Jarvis, MD 06/07/22 2008

## 2022-06-07 NOTE — ED Notes (Signed)
Provider informed of POCT CBG result

## 2022-06-07 NOTE — Discharge Instructions (Signed)
Keep your appointment with the [facility] if you call and they are comfortable with you being positive for COVID but outside the 5-day window since you have been vaccinated.  Drink lots of fluids to stay hydrated.  Tylenol or ibuprofen if needed for fever, throat pain or body aches.  A prescription for metformin was sent to the pharmacy for you to begin taking twice a day to help lower your blood sugar.  Is extremely important for you to read the information about diabetes and nutrition as you should substitute sugar-free drinks for the sports drinks and sodas that you have been drinking.  You can also drink water in place of this as well.  Also avoid foods that are high in sugar such as cakes, pies, cookies, candy bars.  Your primary care provider can reevaluate your blood sugar at your office visit.
